# Patient Record
Sex: Male | Born: 1970
Health system: Southern US, Community
[De-identification: ages and names within clinical notes are randomized; demographics above are authoritative.]

## PROBLEM LIST (undated history)

## (undated) DIAGNOSIS — T7840XA Allergy, unspecified, initial encounter: Secondary | ICD-10-CM

## (undated) HISTORY — PX: WISDOM TOOTH EXTRACTION: SHX21

## (undated) HISTORY — DX: Allergy, unspecified, initial encounter: T78.40XA

---

## 2011-11-28 ENCOUNTER — Ambulatory Visit: Payer: Federal, State, Local not specified - PPO

## 2011-11-28 ENCOUNTER — Ambulatory Visit (INDEPENDENT_AMBULATORY_CARE_PROVIDER_SITE_OTHER): Payer: Federal, State, Local not specified - PPO | Admitting: Emergency Medicine

## 2011-11-28 VITALS — BP 118/68 | HR 93 | Temp 98.6°F | Resp 16 | Ht 68.5 in | Wt 211.4 lb

## 2011-11-28 DIAGNOSIS — R059 Cough, unspecified: Secondary | ICD-10-CM

## 2011-11-28 DIAGNOSIS — R05 Cough: Secondary | ICD-10-CM

## 2011-11-28 DIAGNOSIS — R0600 Dyspnea, unspecified: Secondary | ICD-10-CM

## 2011-11-28 DIAGNOSIS — R918 Other nonspecific abnormal finding of lung field: Secondary | ICD-10-CM

## 2011-11-28 DIAGNOSIS — R9389 Abnormal findings on diagnostic imaging of other specified body structures: Secondary | ICD-10-CM

## 2011-11-28 DIAGNOSIS — R0609 Other forms of dyspnea: Secondary | ICD-10-CM

## 2011-11-28 MED ORDER — ALBUTEROL SULFATE HFA 108 (90 BASE) MCG/ACT IN AERS: 2.0000 | INHALATION_SPRAY | RESPIRATORY_TRACT | Status: DC | PRN

## 2011-11-28 MED ORDER — IPRATROPIUM BROMIDE 0.03 % NA SOLN: 2.0000 | Freq: Two times a day (BID) | NASAL | Status: DC

## 2011-11-28 NOTE — Progress Notes (Signed)
  Subjective:    Patient ID: Ronald Stephenson, male    DOB: 11-11-1970, 40 y.o.   MRN: [id number]  HPI 41 year old male presents with about 3 month history of chest congestion, postnasal drainage, cough, and rhinorrhea. Last night he had a softball game after which he had an episode of wheezing and shortness of breath.  No wheezing today but does admit to chest congestion.  Does admit to a history seasonal allergies for which he has been taking Claritin daily. Also added Mucinex and Sudafed which have helped slightly.  No history of asthma.  He has never used inhalers. Never smoker.      Review of Systems  Constitutional: Negative for fever and chills.  HENT: Positive for congestion, rhinorrhea and postnasal drip. Negative for sore throat.   Respiratory: Positive for cough, chest tightness and wheezing.   Cardiovascular: Negative for chest pain.  Gastrointestinal: Negative for nausea and vomiting.  All other systems reviewed and are negative.       Objective:   Physical Exam  Constitutional: He is oriented to person, place, and time. He appears well-developed and well-nourished.  HENT:  Head: Normocephalic and atraumatic.  Right Ear: External ear normal.  Left Ear: External ear normal.  Mouth/Throat: Oropharynx is clear and moist. No oropharyngeal exudate.  Eyes: Conjunctivae normal are normal.  Neck: Normal range of motion. Neck supple.  Cardiovascular: Normal rate, regular rhythm and normal heart sounds.   Pulmonary/Chest: Effort normal and breath sounds normal.  Abdominal: Soft. Bowel sounds are normal.  Musculoskeletal: Normal range of motion.  Lymphadenopathy:    He has no cervical adenopathy.  Neurological: He is alert and oriented to person, place, and time.  Psychiatric: He has a normal mood and affect. His behavior is normal. Judgment and thought content normal.      [facility] reading (PRIMARY) by  Dr. Cleta Alberts as suspected widening anterior mediastinum. Please comment.        Assessment & Plan:   1. Dyspnea  DG Chest 2 View, CT Chest Wo Contrast  2. Cough  DG Chest 2 View  3. Abnormal CXR  CT Chest Wo Contrast   Likely reactive airway disease secondary to allergic rhinitis Continue Claritin daily Atrovent NS bid Albuterol prn wheezing Will set up CT Chest without contrast to evaluate mediastinal widening and further evaluate for possible mass.  Follow up as needed.

## 2011-11-30 ENCOUNTER — Ambulatory Visit
Admission: RE | Admit: 2011-11-30 | Discharge: 2011-11-30 | Disposition: A | Discharge status: Home / Self Care | Payer: Self-pay | Source: Ambulatory Visit | Attending: Physician Assistant | Admitting: Physician Assistant

## 2011-11-30 ENCOUNTER — Telehealth: Payer: Self-pay | Admitting: Radiology

## 2011-11-30 DIAGNOSIS — R0600 Dyspnea, unspecified: Secondary | ICD-10-CM

## 2011-11-30 DIAGNOSIS — R9389 Abnormal findings on diagnostic imaging of other specified body structures: Secondary | ICD-10-CM

## 2011-11-30 NOTE — Telephone Encounter (Signed)
CT chest call report, is negative, have called patient to advise, if further is needed with this patient please let me know.

## 2011-12-04 NOTE — Telephone Encounter (Signed)
Pt seen on 11-28-11 and pt started running fever last night with cold symptoms. And pt would like to see if a rx for antibiotic can be called in. [phone]

## 2011-12-05 NOTE — Telephone Encounter (Signed)
LMOM to RTC. 

## 2011-12-05 NOTE — Telephone Encounter (Signed)
Recommend he RTC for further evaluation.

## 2011-12-06 ENCOUNTER — Ambulatory Visit (INDEPENDENT_AMBULATORY_CARE_PROVIDER_SITE_OTHER): Payer: Federal, State, Local not specified - PPO | Admitting: Family Medicine

## 2011-12-06 VITALS — BP 122/70 | HR 75 | Temp 97.0°F | Resp 16 | Ht 69.0 in | Wt 206.0 lb

## 2011-12-06 DIAGNOSIS — R0989 Other specified symptoms and signs involving the circulatory and respiratory systems: Secondary | ICD-10-CM

## 2011-12-06 DIAGNOSIS — R05 Cough: Secondary | ICD-10-CM

## 2011-12-06 DIAGNOSIS — R059 Cough, unspecified: Secondary | ICD-10-CM

## 2011-12-06 DIAGNOSIS — J329 Chronic sinusitis, unspecified: Secondary | ICD-10-CM

## 2011-12-06 MED ORDER — HYDROCODONE-HOMATROPINE 5-1.5 MG/5ML PO SYRP: 5.0000 mL | ORAL_SOLUTION | Freq: Every evening | ORAL | Status: DC | PRN

## 2011-12-06 MED ORDER — DOXYCYCLINE HYCLATE 100 MG PO TABS: 100.0000 mg | ORAL_TABLET | Freq: Two times a day (BID) | ORAL | Status: DC

## 2011-12-06 NOTE — Progress Notes (Signed)
[facility]:  Office Visit  Chief Complaint:  Chief Complaint  Patient presents with  . URI    since last ov    HPI: Ronald Stephenson is a 41 y.o. male who complains of  Chest congestion worse since last visit, now in sinuses, no fevers, chills. + cough, green sputum. No ear pain. No CP. + wheezing at night uses albuterol inh.   Past Medical History  Diagnosis Date  . Allergy    Past Surgical History  Procedure Date  . Wisdom tooth extraction    History   Social History  . Marital Status: Married    Spouse Name: N/A    Number of Children: N/A  . Years of Education: N/A   Social History Main Topics  . Smoking status: Never Smoker   . Smokeless tobacco: None  . Alcohol Use: Yes     social  . Drug Use: None  . Sexually Active: None   Other Topics Concern  . None   Social History Narrative  . None   Family History  Problem Relation Age of Onset  . Emphysema Maternal Grandmother   . Cancer Maternal Grandfather     bone cancer  . Heart disease Paternal Grandmother    Allergies  Allergen Reactions  . Erythromycin    Prior to Admission medications   Medication Sig Start Date End Date Taking? Authorizing Provider  albuterol (PROVENTIL HFA;VENTOLIN HFA) 108 (90 BASE) MCG/ACT inhaler Inhale 2 puffs into the lungs every 4 (four) hours as needed for wheezing (cough, shortness of breath or wheezing.). 11/28/11  Yes Heather M Marte, PA-C  cetirizine (ZYRTEC) 10 MG tablet Take 10 mg by mouth daily.   Yes Historical Provider, MD  ipratropium (ATROVENT) 0.03 % nasal spray Place 2 sprays into the nose 2 (two) times daily. 11/28/11  Yes Heather M Marte, PA-C  pseudoephedrine (SUDAFED) 30 MG tablet Take 30 mg by mouth every 4 (four) hours as needed.   Yes Historical Provider, MD  guaiFENesin (MUCINEX) 600 MG 12 hr tablet Take 1,200 mg by mouth 2 (two) times daily.    Historical Provider, MD  loratadine (CLARITIN) 10 MG tablet Take 10 mg by mouth daily.     Historical Provider, MD     ROS: The patient denies fevers, chills, night sweats, unintentional weight loss, chest pain, palpitations, wheezing, dyspnea on exertion, nausea, vomiting, abdominal pain, dysuria, hematuria, melena, numbness, weakness, or tingling.  All other systems have been reviewed and were otherwise negative with the exception of those mentioned in the HPI and as above.    PHYSICAL EXAM: Filed Vitals:   12/06/11 1428  BP: 122/70  Pulse: 75  Temp: 97 F (36.1 C)  Resp: 16   Filed Vitals:   12/06/11 1428  Height: 5\' 9"  (1.753 m)  Weight: 206 lb (93.441 kg)   Body mass index is 30.42 kg/(m^2).  General: Alert, no acute distress HEENT:  Normocephalic, atraumatic, oropharynx patent. TM nl. No exudates. + sinus tenderness Cardiovascular:  Regular rate and rhythm, no rubs murmurs or gallops.  No Carotid bruits, radial pulse intact. No pedal edema.  Respiratory: Clear to auscultation bilaterally.  No wheezes, rales, or rhonchi.  No cyanosis, no use of accessory musculature GI: No organomegaly, abdomen is soft and non-tender, positive bowel sounds.  No masses. Skin: No rashes. Neurologic: Facial musculature symmetric. Psychiatric: Patient is appropriate throughout our interaction. Lymphatic: No cervical lymphadenopathy Musculoskeletal: Gait intact.   LABS: No results found for this or  any previous visit.   EKG/XRAY:   Primary read interpreted by Dr. Conley Rolls at [facility].   ASSESSMENT/PLAN: Encounter Diagnoses  Name Primary?  . Sinusitis Yes  . Chest congestion   . Cough    F/u prn Patient has erythromycin allergy Will rx Doxycycline Continue with other meds for sxs treatmentprn   Reichen Hutzler PHUONG, DO 12/06/2011 3:50 PM

## 2012-03-04 ENCOUNTER — Emergency Department (INDEPENDENT_AMBULATORY_CARE_PROVIDER_SITE_OTHER)
Admission: EM | Admit: 2012-03-04 | Discharge: 2012-03-04 | Disposition: A | Discharge status: Home / Self Care | Payer: BC Managed Care – PPO | Source: Home / Self Care | Attending: Family Medicine | Admitting: Family Medicine

## 2012-03-04 ENCOUNTER — Encounter: Payer: Self-pay | Admitting: *Deleted

## 2012-03-04 DIAGNOSIS — J9801 Acute bronchospasm: Secondary | ICD-10-CM

## 2012-03-04 DIAGNOSIS — J209 Acute bronchitis, unspecified: Secondary | ICD-10-CM

## 2012-03-04 LAB — POCT CBC W AUTO DIFF (K'VILLE URGENT CARE)

## 2012-03-04 MED ORDER — AZITHROMYCIN 250 MG PO TABS: ORAL_TABLET | ORAL | Status: DC

## 2012-03-04 MED ORDER — PREDNISONE 20 MG PO TABS: 20.0000 mg | ORAL_TABLET | Freq: Two times a day (BID) | ORAL | Status: DC

## 2012-03-04 MED ORDER — BENZONATATE 200 MG PO CAPS: 200.0000 mg | ORAL_CAPSULE | Freq: Every day | ORAL | Status: DC

## 2012-03-04 MED ORDER — FLUTICASONE-SALMETEROL 100-50 MCG/DOSE IN AEPB: 1.0000 | INHALATION_SPRAY | Freq: Two times a day (BID) | RESPIRATORY_TRACT | Status: DC

## 2012-03-04 NOTE — ED Provider Notes (Signed)
History     CSN: [id number]  Arrival date & time 03/04/12  1051   First MD Initiated Contact with Patient 03/04/12 1107      Chief Complaint  Patient presents with  . Fever  . Shortness of Breath      HPI Comments: Patient complains of two day history of myalgias, persistent low grade fever, non-productive cough, fatigue, and increased wheezing.  He also has a sense of anterior chest tightness and mild shortness of breath at times.  No sore throat or nasal congestion. He recalls that last summer he began wheezing after a softball game, and in October 2013 he had a rather prolonged respiratory illness with wheezing that responded to doxycycline.  Since then he notes that he wheezes several times per week and must use a Ventolin inhaler 2 to 3 times per week.  He had no previous diagnosis of asthma.  He has a sister who was diagnosed with exercise induced asthma in college.  He has had a seasonal flu shot.  He does not remember his last Tdap.  The history is provided by the patient.    Past Medical History  Diagnosis Date  . Allergy     Past Surgical History  Procedure Date  . Wisdom tooth extraction     Family History  Problem Relation Age of Onset  . Emphysema Maternal Grandmother   . Cancer Maternal Grandfather     bone cancer  . Heart disease Paternal Grandmother   . Heart disease Father   . Hypertension Father   . Asthma Sister     History  Substance Use Topics  . Smoking status: Never Smoker   . Smokeless tobacco: Not on file  . Alcohol Use: Yes     Comment: social      Review of Systems No sore throat + cough No pleuritic pain but has anterior chest tightness + wheezing No nasal congestion No post-nasal drainage No sinus pain/pressure No itchy/red eyes No earache No hemoptysis + SOB + fever, + chills No nausea No vomiting No abdominal pain No diarrhea No urinary symptoms No skin rashes + fatigue + myalgias + headache Used OTC meds  without relief  Allergies  Erythromycin:  GI intolerance only  Home Medications   Current Outpatient Rx  Name  Route  Sig  Dispense  Refill  . ALBUTEROL SULFATE HFA 108 (90 BASE) MCG/ACT IN AERS   Inhalation   Inhale 2 puffs into the lungs every 4 (four) hours as needed for wheezing (cough, shortness of breath or wheezing.).   1 Inhaler   1   . AZITHROMYCIN 250 MG PO TABS      Take 2 tabs today; then begin one tab once daily for 4 more days   6 each   0   . BENZONATATE 200 MG PO CAPS   Oral   Take 1 capsule (200 mg total) by mouth at bedtime. Take as needed for cough   12 capsule   0   . CETIRIZINE HCL 10 MG PO TABS   Oral   Take 10 mg by mouth daily.         Marland Kitchen DOXYCYCLINE HYCLATE 100 MG PO TABS   Oral   Take 1 tablet (100 mg total) by mouth 2 (two) times daily.   20 tablet   0   . FLUTICASONE-SALMETEROL 100-50 MCG/DOSE IN AEPB   Inhalation   Inhale 1 puff into the lungs 2 (two) times daily.   60 each  1   . GUAIFENESIN ER 600 MG PO TB12   Oral   Take 1,200 mg by mouth 2 (two) times daily.         Marland Kitchen HYDROCODONE-HOMATROPINE 5-1.5 MG/5ML PO SYRP   Oral   Take 5 mLs by mouth at bedtime as needed for cough.   120 mL   0   . IPRATROPIUM BROMIDE 0.03 % NA SOLN   Nasal   Place 2 sprays into the nose 2 (two) times daily.   30 mL   5   . LORATADINE 10 MG PO TABS   Oral   Take 10 mg by mouth daily.         Marland Kitchen PREDNISONE 20 MG PO TABS   Oral   Take 1 tablet (20 mg total) by mouth 2 (two) times daily.   10 tablet   0   . PSEUDOEPHEDRINE HCL 30 MG PO TABS   Oral   Take 30 mg by mouth every 4 (four) hours as needed.           BP 135/87  Pulse 107  Temp 99.2 F (37.3 C) (Oral)  Resp 18  Wt 207 lb (93.895 kg)  SpO2 95%  Physical Exam Nursing notes and Vital Signs reviewed. Appearance:  Patient appears healthy, stated age, and in no acute distress Eyes:  Pupils are equal, round, and reactive to light and accomodation.  Extraocular movement  is intact.  Conjunctivae are not inflamed  Ears:  Canals normal.  Tympanic membranes normal.  Nose:  Mildly congested turbinates.  No sinus tenderness.   Pharynx:  Normal Neck:  Supple.  No adenopathy Lungs:   Faint wheeze heard left posterior chest but no rales or rhonchi.  Breath sounds are equal.  Heart:  Regular rate and rhythm without murmurs, rubs, or gallops.  Abdomen:  Nontender without masses or hepatosplenomegaly.  Bowel sounds are present.  No CVA or flank tenderness.  Extremities:  No edema.  No calf tenderness Skin:  No rash present.   ED Course  Procedures none   Labs Reviewed  POCT CBC W AUTO DIFF ([facility])  WBC 6.5; LY 18.8; MO 10.1; GR 71.1; Hgb 14.5; Platelets 260       1. Acute bronchitis   2. Bronchospasm       MDM  Begin Z-pack to cover atypical agents (note that patient has taken in past without adverse effects).  Begin prednisone burst.  Begin trial of Advair. Prescription written for Benzonatate [facility]) to take at bedtime for night-time cough.  Take plain Mucinex (guaifenesin) twice daily for cough and congestion.  May add Sudafed if sinus congestion develops.  Increase fluid intake, rest. For sinus congestion may also use Afrin nasal spray (or generic oxymetazoline) twice daily for about 5 days.  Also recommend using saline nasal spray several times daily and saline nasal irrigation (AYR is a common brand) Stop all antihistamines for now, and other non-prescription cough/cold preparations. Continue Ventolin inhaler as needed. Recommend a Tdap when well.  Follow-up with family doctor if not improving 7 to 10 days.  Consider evaluation by a pulmonologist if wheezing does not improve significantly with Advair        Lattie Haw, MD 03/04/12 [phone]

## 2012-03-04 NOTE — ED Notes (Signed)
Pt c/o fever, SOB and cough x 2 days. He has taken Mucinex and Advil.

## 2012-03-05 ENCOUNTER — Telehealth: Payer: Self-pay | Admitting: *Deleted

## 2012-04-24 ENCOUNTER — Ambulatory Visit (INDEPENDENT_AMBULATORY_CARE_PROVIDER_SITE_OTHER): Payer: Federal, State, Local not specified - PPO | Admitting: Internal Medicine

## 2012-04-24 ENCOUNTER — Encounter: Payer: Self-pay | Admitting: Internal Medicine

## 2012-04-24 VITALS — BP 146/82 | HR 90 | Ht 68.0 in | Wt 211.6 lb

## 2012-04-24 DIAGNOSIS — J45998 Other asthma: Secondary | ICD-10-CM

## 2012-04-24 DIAGNOSIS — J302 Other seasonal allergic rhinitis: Secondary | ICD-10-CM | POA: Insufficient documentation

## 2012-04-24 DIAGNOSIS — J309 Allergic rhinitis, unspecified: Secondary | ICD-10-CM

## 2012-04-24 DIAGNOSIS — J45909 Unspecified asthma, uncomplicated: Secondary | ICD-10-CM

## 2012-04-24 NOTE — Progress Notes (Signed)
04/24/12- 41 yoM never smoker -Self referral-wheezing; states feeling better since finishing Advair. Has noted sneeze, nasal congestion and drainage last 2 summers. Has lived n this area x 20 years. New onset of wheezing first noted in Oct, 2013 after a softball game- wife noted he was wheezing. An Urgent Care put him on rescue inhaler, which helped. In Jan 2014, a viral syndrome acute bronchitis was Rx'd w/ prednisone taper, Zpak, Advair. Ran out of Advair 2 weeks ago and has remained clear. Has not needed his rescue inhaler. He now feels well. He had no prior lung disease or child-hood asthma. 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 No hx of cardiac disease. Sister has exercise asthma. CT chest 11/30/11 IMPRESSION:  Negative. No mass or lymphadenopathy within the thorax.  Original Report Authenticated By: Danae Orleans, M.D.  Prior to Admission medications   Medication Sig Start Date End Date Taking? Authorizing Provider  albuterol (PROVENTIL HFA;VENTOLIN HFA) 108 (90 BASE) MCG/ACT inhaler Inhale 2 puffs into the lungs every 4 (four) hours as needed for wheezing (cough, shortness of breath or wheezing.). 11/28/11  Yes Heather M Marte, PA-C  guaiFENesin (MUCINEX) 600 MG 12 hr tablet Take 1,200 mg by mouth 2 (two) times daily.    Historical Provider, MD  loratadine (CLARITIN) 10 MG tablet Take 10 mg by mouth daily.    Historical Provider, MD  pseudoephedrine (SUDAFED) 30 MG tablet Take 30 mg by mouth every 4 (four) hours as needed.    Historical Provider, MD   Past Medical History  Diagnosis Date  . Allergy    Past Surgical History  Procedure Laterality Date  . Wisdom tooth extraction     Family History  Problem Relation Age of Onset  . Emphysema Maternal Grandmother   . Cancer Maternal Grandfather     bone cancer  . Heart disease Paternal Grandmother   . Heart disease Father   . Hypertension Father   . Asthma Sister    History   Social History  .  Marital Status: Married    Spouse Name: N/A    Number of Children: N/A  . Years of Education: N/A   Occupational History  . Not on file.   Social History Main Topics  . Smoking status: Never Smoker   . Smokeless tobacco: Not on file  . Alcohol Use: Yes     Comment: social  . Drug Use: Not on file  . Sexually Active: Not on file   Other Topics Concern  . Not on file   Social History Narrative  . No narrative on file   ROS-see HPI Constitutional:   No-   weight loss, night sweats, fevers, chills, fatigue, lassitude. HEENT:   No-  headaches, difficulty swallowing, tooth/dental problems, sore throat,       No-  sneezing, itching, ear ache, nasal congestion, post nasal drip,  CV:  No-   chest pain, orthopnea, PND, swelling in lower extremities, anasarca,                                  dizziness, palpitations Resp: No-   shortness of breath with exertion or at rest.              No-   productive cough,  No non-productive cough,  No- coughing up of blood.  No-   change in color of mucus.  No- wheezing.   Skin: No-   rash or lesions. GI:  No-   heartburn, indigestion, abdominal pain, nausea, vomiting, diarrhea,                 change in bowel habits, loss of appetite GU: No-   dysuria, change in color of urine, no urgency or frequency.  No- flank pain. MS:  No-   joint pain or swelling.  No- decreased range of motion.  No- back pain. Neuro-     nothing unusual Psych:  No- change in mood or affect. No depression or anxiety.  No memory loss.  OBJ- Physical Exam General- Alert, Oriented, Affect-appropriate, Distress- none acute Skin- rash-none, lesions- none, excoriation- none Lymphadenopathy- none Head- atraumatic. Thick neck            Eyes- Gross vision intact, PERRLA, conjunctivae and secretions clear            Ears- Hearing, canals-normal            Nose- Clear, no-Septal dev, mucus, polyps, erosion, perforation             Throat- Mallampati III , mucosa clear  , drainage- none, tonsils- atrophic Neck- flexible , trachea midline, no stridor , thyroid nl, carotid no bruit Chest - symmetrical excursion , unlabored           Heart/CV- RRR , no murmur , no gallop  , no rub, nl s1 s2                           - JVD- none , edema- none, stasis changes- none, varices- none           Lung- clear to P&A, wheeze- none, cough- none , dullness-none, rub- none           Chest wall-  Abd- tender-no, distended-no, bowel sounds-present, HSM- no Br/ Gen/ Rectal- Not done, not indicated Extrem- cyanosis- none, clubbing, none, atrophy- none, strength- nl Neuro- grossly intact to observation

## 2012-04-24 NOTE — Patient Instructions (Addendum)
Order- schedule PFT    Dx asthma  Ok to use the rescue inhaler Ventolin HFA   2 puffs every 4 hours if needed  Please call as needed

## 2012-04-24 NOTE — Assessment & Plan Note (Signed)
We discussed use of non-sedating otc antihistamines as needed.

## 2012-04-24 NOTE — Assessment & Plan Note (Signed)
Unclear reason for onset of wheezing after softball game in the Fall of 2013, persisting through the winter. Not clear if Advair caused resolution, or he got better with time. Plan- PFT to establish current lung status. Revisit in Spring pollen season.

## 2012-06-26 ENCOUNTER — Ambulatory Visit: Payer: Federal, State, Local not specified - PPO | Admitting: Internal Medicine

## 2012-07-01 ENCOUNTER — Ambulatory Visit (INDEPENDENT_AMBULATORY_CARE_PROVIDER_SITE_OTHER): Payer: Federal, State, Local not specified - PPO | Admitting: Internal Medicine

## 2012-07-01 ENCOUNTER — Encounter: Payer: Self-pay | Admitting: Internal Medicine

## 2012-07-01 ENCOUNTER — Other Ambulatory Visit: Payer: Federal, State, Local not specified - PPO

## 2012-07-01 VITALS — BP 142/80 | HR 80 | Temp 100.0°F | Ht 68.0 in | Wt 209.0 lb

## 2012-07-01 DIAGNOSIS — J45909 Unspecified asthma, uncomplicated: Secondary | ICD-10-CM

## 2012-07-01 DIAGNOSIS — J45998 Other asthma: Secondary | ICD-10-CM

## 2012-07-01 LAB — PULMONARY FUNCTION TEST

## 2012-07-01 MED ORDER — FLUNISOLIDE HFA 80 MCG/ACT IN AERS: 2.0000 | INHALATION_SPRAY | Freq: Two times a day (BID) | RESPIRATORY_TRACT | Status: DC

## 2012-07-01 MED ORDER — FLUTICASONE PROPIONATE HFA 110 MCG/ACT IN AERO: INHALATION_SPRAY | RESPIRATORY_TRACT | Status: DC

## 2012-07-01 NOTE — Progress Notes (Signed)
04/24/12- 41 yoM never smoker -Self referral-wheezing; states feeling better since finishing Advair. Has noted sneeze, nasal congestion and drainage last 2 summers. Has lived n this area x 20 years. New onset of wheezing first noted in Oct, 2013 after a softball game- wife noted he was wheezing. An Urgent Care put him on rescue inhaler, which helped. In Jan 2014, a viral syndrome acute bronchitis was Rx'd w/ prednisone taper, Zpak, Advair. Ran out of Advair 2 weeks ago and has remained clear. Has not needed his rescue inhaler. He now feels well. He had no prior lung disease or child-hood asthma. 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 No hx of cardiac disease. Sister has exercise asthma. CT chest 11/30/11 IMPRESSION:  Negative. No mass or lymphadenopathy within the thorax.  Original Report Authenticated By: Danae Orleans, M.D.  07/01/12- 64 yoM never smoker -followed for asthma, rhinitis. FOLLOWS FOR:had PFT today-c/o nasal and chest congestion x 1wk.,cough-dk. green,clear;sob worse,occass. wheezing,no cp or tightness, had fever last wk. only Did well after last here until starting one week ago when he developed sore throat and green discharge, which is now clear. Using Ventolin 2 or 3 times per week. PFT 07/01/2012-mild to moderate obstructive airways disease with response to bronchodilator, normal lung volumes, normal diffusion capacity. FVC 3.93/77%, FEV1 3.04/75%, FEV1/FVC 0.77, FEF 25-75% 2.66/70%. TLC 85%, DLCO 104%.  ROS-see HPI Constitutional:   No-   weight loss, night sweats, fevers, chills, fatigue, lassitude. HEENT:   No-  headaches, difficulty swallowing, tooth/dental problems, sore throat,       +sneezing, itching, ear ache, nasal congestion, post nasal drip,  CV:  No-   chest pain, orthopnea, PND, swelling in lower extremities, anasarca,                                  dizziness, palpitations Resp: No-   shortness of breath with exertion or at rest.               + productive cough,  No non-productive cough,  No- coughing up of blood.              + change in color of mucus.  + wheezing.   Skin: No-   rash or lesions. GI:  No-   heartburn, indigestion, abdominal pain, nausea, vomiting,  GU: . MS:  No-   joint pain or swelling.  these Neuro-     nothing unusual Psych:  No- change in mood or affect. No depression or anxiety.  No memory loss.  OBJ- Physical Exam General- Alert, Oriented, Affect-appropriate, Distress- none acute Skin- rash-none, lesions- none, excoriation- none Lymphadenopathy- none Head- atraumatic. Thick neck            Eyes- Gross vision intact, PERRLA, conjunctivae and secretions clear            Ears- Hearing, canals-normal            Nose- Clear, no-Septal dev, +mucus, polyps, erosion, perforation             Throat- Mallampati III-IV , mucosa clear , drainage+clear mucus, tonsils- atrophic Neck- flexible , trachea midline, no stridor , thyroid nl, carotid no bruit Chest - symmetrical excursion , unlabored           Heart/CV- RRR , no murmur , no gallop  , no rub, nl s1 s2                           -  JVD- none , edema- none, stasis changes- none, varices- none           Lung-  wheeze-+mild, unlabored, cough- none , dullness-none, rub- none           Chest wall-  Abd-  Br/ Gen/ Rectal- Not done, not indicated Extrem- cyanosis- none, clubbing, none, atrophy- none, strength- nl Neuro- grossly intact to observation

## 2012-07-01 NOTE — Progress Notes (Signed)
PFT done today. 

## 2012-07-01 NOTE — Patient Instructions (Addendum)
Sample Aerospan steroid maintenance inhaler    2 puffs then rinse mouth, twice daily  When the sample is filled, switch to the prescribed equivalent Flovent 110, same directions  Use the Ventolin rescue inhaler as needed  Order- lab   Allergy profile   Dx asthma

## 2012-07-02 LAB — ALLERGY FULL PROFILE
Allergen, D pternoyssinus,d7: <0.1 kU/L
Allergen,Goose feathers, e70: <0.1 kU/L
Box Elder IgE: <0.1 kU/L
Candida Albicans: <0.1 kU/L
Common Ragweed: <0.1 kU/L
G005 Rye, Perennial: <0.1 kU/L
G009 Red Top: <0.1 kU/L
House Dust Hollister: <0.1 kU/L
IgE (Immunoglobulin E), Serum: 17.5 IU/mL (ref 0.0–180.0)
Oak: <0.1 kU/L
Stemphylium Botryosum: <0.1 kU/L
Timothy Grass: <0.1 kU/L

## 2012-07-04 ENCOUNTER — Telehealth: Payer: Self-pay | Admitting: Internal Medicine

## 2012-07-04 MED ORDER — AMOXICILLIN-POT CLAVULANATE 875-125 MG PO TABS: 1.0000 | ORAL_TABLET | Freq: Two times a day (BID) | ORAL | Status: DC

## 2012-07-04 NOTE — Telephone Encounter (Signed)
Lab results as follows: Notes Recorded by Waymon Budge, MD on 07/03/2012 at 1:27 PM IgE class allergy antibodies are not elevated for the common triggers. We can discuss at next ov. Pt aware of lab results.   Last OV 07/01/12. Pt called to c/o having productive cough with yellow phlegm, chest congestion, sinus congestion, wheezing x 1.5 weeks. Pt states he had these symptoms at last OV but felt they would get better on their own but instead they have gotten worse. Please advise. Carron Curie, CMA Allergies  Allergen Reactions  . Erythromycin

## 2012-07-04 NOTE — Telephone Encounter (Signed)
Called and spoke with pt and he is aware that per CY---call in augmentin 875 mg  #14  1 po bid until gone.  Pt is aware that this has been called to his pharmacy and nothing further is needed.

## 2012-07-12 NOTE — Assessment & Plan Note (Signed)
Recent acute exacerbation sounds viral but could possibly have been an allergic episode in pollen season. Plan-try steroid inhaler. We can give Aerospan sample but then cheaper prescription for Flovent 110. Lab for allergy profile.

## 2012-07-24 ENCOUNTER — Encounter: Payer: Self-pay | Admitting: Internal Medicine

## 2012-10-03 ENCOUNTER — Ambulatory Visit: Payer: Federal, State, Local not specified - PPO | Admitting: Internal Medicine

## 2012-10-17 ENCOUNTER — Ambulatory Visit (INDEPENDENT_AMBULATORY_CARE_PROVIDER_SITE_OTHER): Payer: Federal, State, Local not specified - PPO | Admitting: Internal Medicine

## 2012-10-17 ENCOUNTER — Encounter: Payer: Self-pay | Admitting: Internal Medicine

## 2012-10-17 VITALS — BP 140/90 | HR 82 | Ht 69.0 in | Wt 211.8 lb

## 2012-10-17 DIAGNOSIS — J45909 Unspecified asthma, uncomplicated: Secondary | ICD-10-CM

## 2012-10-17 DIAGNOSIS — J45998 Other asthma: Secondary | ICD-10-CM

## 2012-10-17 NOTE — Patient Instructions (Addendum)
Ok if you remain stable, to reduce the Flovent 110 to 2 puffs, then rinse, ONCE daily.  If you see no change over the next few weeks, you may choose to drop it off entirely and watch. We would increase it if you found yourself needing the rescue inhaler more than 2-3 times per week, or if you felt a cold coming on.   Please call as needed

## 2012-10-17 NOTE — Progress Notes (Signed)
04/24/12- 41 yoM never smoker -Self referral-wheezing; states feeling better since finishing Advair. Has noted sneeze, nasal congestion and drainage last 2 summers. Has lived n this area x 20 years. New onset of wheezing first noted in Oct, 2013 after a softball game- wife noted he was wheezing. An Urgent Care put him on rescue inhaler, which helped. In Jan 2014, a viral syndrome acute bronchitis was Rx'd w/ prednisone taper, Zpak, Advair. Ran out of Advair 2 weeks ago and has remained clear. Has not needed his rescue inhaler. He now feels well. He had no prior lung disease or child-hood asthma. 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 No hx of cardiac disease. Sister has exercise asthma. CT chest 11/30/11 IMPRESSION:  Negative. No mass or lymphadenopathy within the thorax.  Original Report Authenticated By: Danae Orleans, M.D.  07/01/12- 89 yoM never smoker -followed for asthma, rhinitis. FOLLOWS FOR:had PFT today-c/o nasal and chest congestion x 1wk.,cough-dk. green,clear;sob worse,occass. wheezing,no cp or tightness, had fever last wk. only Did well after last here until starting one week ago when he developed sore throat and green discharge, which is now clear. Using Ventolin 2 or 3 times per week. PFT 07/01/2012-mild to moderate obstructive airways disease with response to bronchodilator, normal lung volumes, normal diffusion capacity. FVC 3.93/77%, FEV1 3.04/75%, FEV1/FVC 0.77, FEF 25-75% 2.66/70%. TLC 85%, DLCO 104%.  10/17/12- 42 yoM never smoker -followed for asthma, rhinitis. FOLLOWS FOR: reports breathing is doing well.  no new complaints. We discussed medication strategies  ROS-see HPI Constitutional:   No-   weight loss, night sweats, fevers, chills, fatigue, lassitude. HEENT:   No-  headaches, difficulty swallowing, tooth/dental problems, sore throat,       No-sneezing, itching, ear ache, nasal congestion, post nasal drip,  CV:  No-   chest pain,  orthopnea, PND, swelling in lower extremities, anasarca,  dizziness, palpitations Resp: No-   shortness of breath with exertion or at rest.              No- productive cough,  No non-productive cough,  No- coughing up of blood.              No- change in color of mucus.  No- wheezing.   Skin: No-   rash or lesions. GI:  No-   heartburn, indigestion, abdominal pain, nausea, vomiting,  GU: . MS:  No-   joint pain or swelling.  these Neuro-     nothing unusual Psych:  No- change in mood or affect. No depression or anxiety.  No memory loss.  OBJ- Physical Exam General- Alert, Oriented, Affect-appropriate, Distress- none acute Skin- rash-none, lesions- none, excoriation- none Lymphadenopathy- none Head- atraumatic. Thick neck            Eyes- Gross vision intact, PERRLA, conjunctivae and secretions clear            Ears- Hearing, canals-normal            Nose- Clear, no-Septal dev, +mucus,no-polyps, erosion, perforation             Throat- Mallampati III-IV , mucosa clear , drainage-none, tonsils- atrophic Neck- flexible , trachea midline, no stridor , thyroid nl, carotid no bruit Chest - symmetrical excursion , unlabored           Heart/CV- RRR , no murmur , no gallop  , no rub, nl s1 s2                           -  JVD- none , edema- none, stasis changes- none, varices- none           Lung-  wheeze-none, unlabored, cough- none , dullness-none, rub- none           Chest wall-  Abd-  Br/ Gen/ Rectal- Not done, not indicated Extrem- cyanosis- none, clubbing, none, atrophy- none, strength- nl Neuro- grossly intact to observation

## 2012-10-26 NOTE — Assessment & Plan Note (Signed)
Good control Plan-okay to try reducing Flovent 110 to 2 puffs once daily while he is this stable

## 2013-04-24 ENCOUNTER — Ambulatory Visit: Payer: Federal, State, Local not specified - PPO | Admitting: Internal Medicine

## 2013-04-30 ENCOUNTER — Encounter: Payer: Self-pay | Admitting: Internal Medicine

## 2013-04-30 ENCOUNTER — Ambulatory Visit (INDEPENDENT_AMBULATORY_CARE_PROVIDER_SITE_OTHER): Payer: Federal, State, Local not specified - PPO | Admitting: Internal Medicine

## 2013-04-30 ENCOUNTER — Encounter (INDEPENDENT_AMBULATORY_CARE_PROVIDER_SITE_OTHER): Payer: Self-pay

## 2013-04-30 VITALS — BP 120/76 | HR 71 | Ht 69.0 in | Wt 216.8 lb

## 2013-04-30 DIAGNOSIS — J45998 Other asthma: Secondary | ICD-10-CM

## 2013-04-30 DIAGNOSIS — J309 Allergic rhinitis, unspecified: Secondary | ICD-10-CM

## 2013-04-30 DIAGNOSIS — J302 Other seasonal allergic rhinitis: Secondary | ICD-10-CM

## 2013-04-30 DIAGNOSIS — J45909 Unspecified asthma, uncomplicated: Secondary | ICD-10-CM

## 2013-04-30 MED ORDER — ALBUTEROL SULFATE HFA 108 (90 BASE) MCG/ACT IN AERS: 2.0000 | INHALATION_SPRAY | RESPIRATORY_TRACT | Status: DC | PRN

## 2013-04-30 NOTE — Progress Notes (Signed)
04/24/12- 41 yoM never smoker -Self referral-wheezing; states feeling better since finishing Advair. Has noted sneeze, nasal congestion and drainage last 2 summers. Has lived n this area x 20 years. New onset of wheezing first noted in Oct, 2013 after a softball game- wife noted he was wheezing. An Urgent Care put him on rescue inhaler, which helped. In Jan 2014, a viral syndrome acute bronchitis was Rx'd w/ prednisone taper, Zpak, Advair. Ran out of Advair 2 weeks ago and has remained clear. Has not needed his rescue inhaler. He now feels well. He had no prior lung disease or child-hood asthma. 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 No hx of cardiac disease. Sister has exercise asthma. CT chest 11/30/11 IMPRESSION:  Negative. No mass or lymphadenopathy within the thorax.  Original Report Authenticated By: Danae Orleans, M.D.  07/01/12- 78 yoM never smoker -followed for asthma, rhinitis. FOLLOWS FOR:had PFT today-c/o nasal and chest congestion x 1wk.,cough-dk. green,clear;sob worse,occass. wheezing,no cp or tightness, had fever last wk. only Did well after last here until starting one week ago when he developed sore throat and green discharge, which is now clear. Using Ventolin 2 or 3 times per week. PFT 07/01/2012-mild to moderate obstructive airways disease with response to bronchodilator, normal lung volumes, normal diffusion capacity. FVC 3.93/77%, FEV1 3.04/75%, FEV1/FVC 0.77, FEF 25-75% 2.66/70%. TLC 85%, DLCO 104%.  10/17/12- 42 yoM never smoker -followed for asthma, rhinitis. FOLLOWS FOR: reports breathing is doing well.  no new complaints. We discussed medication strategies  04/30/13- 42 yoM never smoker -followed for asthma, rhinitis. FOLLOWS FOR: Has went back to BID on Flovent as his daughter is in preschoold and bringing home more colds and germs. He feels that colds clear more quickly since he is using Flovent. No seasonal concerns.  ROS-see  HPI Constitutional:   No-   weight loss, night sweats, fevers, chills, fatigue, lassitude. HEENT:   No-  headaches, difficulty swallowing, tooth/dental problems, sore throat,       No-sneezing, itching, ear ache, nasal congestion, post nasal drip,  CV:  No-   chest pain, orthopnea, PND, swelling in lower extremities, anasarca,  dizziness, palpitations Resp: No-   shortness of breath with exertion or at rest.              No- productive cough,  No non-productive cough,  No- coughing up of blood.              No- change in color of mucus.  No- wheezing.   Skin: No-   rash or lesions. GI:  No-  heartburn, indigestion, abdominal pain, nausea, vomiting,  GU: . MS:  No-   joint pain or swelling.  these Neuro-     nothing unusual Psych:  No- change in mood or affect. No depression or anxiety.  No memory loss.  OBJ- Physical Exam General- Alert, Oriented, Affect-appropriate, Distress- none acute Skin- rash-none, lesions- none, excoriation- none Lymphadenopathy- none Head- atraumatic. Thick neck            Eyes- Gross vision intact, PERRLA, conjunctivae and secretions clear            Ears- Hearing, canals-normal            Nose- Clear, no-Septal dev, +mucus,no-polyps, erosion, perforation             Throat- Mallampati III-IV , mucosa clear , drainage-none, tonsils- atrophic Neck- flexible , trachea midline, no stridor , thyroid nl, carotid no bruit  Chest - symmetrical excursion , unlabored           Heart/CV- RRR , no murmur , no gallop  , no rub, nl s1 s2                           - JVD- none , edema- none, stasis changes- none, varices- none           Lung-  wheeze-none, unlabored, cough- none , dullness-none, rub- none           Chest wall-  Abd-  Br/ Gen/ Rectal- Not done, not indicated Extrem- cyanosis- none, clubbing, none, atrophy- none, strength- nl Neuro- grossly intact to observation

## 2013-04-30 NOTE — Assessment & Plan Note (Signed)
Occasional antihistamine should be sufficient as discussed.

## 2013-04-30 NOTE — Assessment & Plan Note (Signed)
Well controlled.  Plan-continue Flovent

## 2013-04-30 NOTE — Patient Instructions (Signed)
Refill script for Proair rescue inhaler sent  Please call as needed

## 2013-07-17 ENCOUNTER — Other Ambulatory Visit: Payer: Self-pay | Admitting: Internal Medicine

## 2013-08-19 ENCOUNTER — Other Ambulatory Visit: Payer: Self-pay | Admitting: Internal Medicine

## 2013-10-16 ENCOUNTER — Telehealth: Payer: Self-pay | Admitting: Internal Medicine

## 2013-10-16 MED ORDER — ALBUTEROL SULFATE HFA 108 (90 BASE) MCG/ACT IN AERS: 2.0000 | INHALATION_SPRAY | RESPIRATORY_TRACT | Status: DC | PRN

## 2013-10-16 NOTE — Telephone Encounter (Signed)
Called and spoke to pt. Refill sent to preferred pharmacy. Pt aware to call back if there are any issues with the medication, albuterol hfa, being filled. Pt verbalized understanding and denied any further questions or concerns at this time.

## 2013-10-19 ENCOUNTER — Telehealth: Payer: Self-pay | Admitting: Internal Medicine

## 2013-10-19 MED ORDER — FLUTICASONE PROPIONATE HFA 110 MCG/ACT IN AERO: INHALATION_SPRAY | RESPIRATORY_TRACT | Status: DC

## 2013-10-19 NOTE — Telephone Encounter (Signed)
Spoke with pt. Aware RX called in for 1 year supply of flovent. Nothing further needed

## 2014-04-30 ENCOUNTER — Ambulatory Visit: Payer: Federal, State, Local not specified - PPO | Admitting: Internal Medicine

## 2014-05-06 ENCOUNTER — Ambulatory Visit (INDEPENDENT_AMBULATORY_CARE_PROVIDER_SITE_OTHER): Payer: Federal, State, Local not specified - PPO | Admitting: Internal Medicine

## 2014-05-06 ENCOUNTER — Encounter: Payer: Self-pay | Admitting: Internal Medicine

## 2014-05-06 VITALS — BP 118/70 | HR 78 | Ht 69.0 in | Wt 212.2 lb

## 2014-05-06 DIAGNOSIS — J45998 Other asthma: Secondary | ICD-10-CM

## 2014-05-06 DIAGNOSIS — J302 Other seasonal allergic rhinitis: Secondary | ICD-10-CM

## 2014-05-06 MED ORDER — ALBUTEROL SULFATE HFA 108 (90 BASE) MCG/ACT IN AERS: 2.0000 | INHALATION_SPRAY | RESPIRATORY_TRACT | Status: DC | PRN

## 2014-05-06 NOTE — Patient Instructions (Signed)
Script printed to hold for albuterol rescue inhaler to keep available  Ok to continue with Flonase otc and with an antihistamine like claritin if needed  Please call if we can help

## 2014-05-06 NOTE — Progress Notes (Signed)
04/24/12- 41 yoM never smoker -Self referral-wheezing; states feeling better since finishing Advair. Has noted sneeze, nasal congestion and drainage last 2 summers. Has lived n this area x 20 years. New onset of wheezing first noted in Oct, 2013 after a softball game- wife noted he was wheezing. An Urgent Care put him on rescue inhaler, which helped. In Jan 2014, a viral syndrome acute bronchitis was Rx'd w/ prednisone taper, Zpak, Advair. Ran out of Advair 2 weeks ago and has remained clear. Has not needed his rescue inhaler. He now feels well. He had no prior lung disease or child-hood asthma. 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 No hx of cardiac disease. Sister has exercise asthma. CT chest 11/30/11 IMPRESSION:  Negative. No mass or lymphadenopathy within the thorax.  Original Report Authenticated By: Danae OrleansJOHN A. STAHL, M.D.  07/01/12- [age] yoM never smoker -followed for asthma, rhinitis. FOLLOWS FOR:had PFT today-c/o nasal and chest congestion x 1wk.,cough-dk. green,clear;sob worse,occass. wheezing,no cp or tightness, had fever last wk. only Did well after last here until starting one week ago when he developed sore throat and green discharge, which is now clear. Using Ventolin 2 or 3 times per week. PFT 07/01/2012-mild to moderate obstructive airways disease with response to bronchodilator, normal lung volumes, normal diffusion capacity. FVC 3.93/77%, FEV1 3.04/75%, FEV1/FVC 0.77, FEF 25-75% 2.66/70%. TLC 85%, DLCO 104%.  10/17/12- 42 yoM never smoker -followed for asthma, rhinitis. FOLLOWS FOR: reports breathing is doing well.  no new complaints. We discussed medication strategies  04/30/13- 42 yoM never smoker -followed for asthma, rhinitis. FOLLOWS FOR: Has went back to BID on Flovent as his daughter is in preschool and bringing home more colds and germs. He feels that colds clear more quickly since he is using Flovent. No seasonal concerns.  05/06/14- 43 yoM  never smoker -followed for asthma, rhinitis FOLLOW FOR States not taking claritin anymore and is using flonase once a day.  Asthma control has been very good with no use of rescue inhaler in over a year.  ROS-see HPI Constitutional:   No-   weight loss, night sweats, fevers, chills, fatigue, lassitude. HEENT:   No-  headaches, difficulty swallowing, tooth/dental problems, sore throat,       No-sneezing, itching, ear ache, nasal congestion, post nasal drip,  CV:  No-   chest pain, orthopnea, PND, swelling in lower extremities, anasarca,  dizziness, palpitations Resp: No-   shortness of breath with exertion or at rest.              No- productive cough,  No non-productive cough,  No- coughing up of blood.  No- change in color of mucus.  No- wheezing.   Skin: No-   rash or lesions. GI:  No-  heartburn, indigestion, abdominal pain, nausea, vomiting,  GU: . MS:  No-   joint pain or swelling.  these Neuro-     nothing unusual Psych:  No- change in mood or affect. No depression or anxiety.  No memory loss.  OBJ- Physical Exam General- Alert, Oriented, Affect-appropriate, Distress- none acute Skin- rash-none, lesions- none, excoriation- none Lymphadenopathy- none Head- atraumatic. Thick neck            Eyes- Gross vision intact, PERRLA, conjunctivae and secretions clear            Ears- Hearing, canals-normal            Nose- Clear, no-Septal dev, +mucus,no-polyps, erosion, perforation  Throat- Mallampati III-IV , mucosa clear , drainage-none, tonsils- atrophic Neck- flexible , trachea midline, no stridor , thyroid nl, carotid no bruit Chest - symmetrical excursion , unlabored           Heart/CV- RRR , no murmur , no gallop  , no rub, nl s1 s2                           - JVD- none , edema- none, stasis changes- none, varices- none           Lung-  wheeze-none, unlabored, cough- none , dullness-none, rub- none           Chest wall-  Abd-  Br/ Gen/ Rectal- Not done, not  indicated Extrem- cyanosis- none, clubbing, none, atrophy- none, strength- nl Neuro- grossly intact to observation

## 2014-05-09 NOTE — Assessment & Plan Note (Signed)
Mild intermittent asthma well-controlled, uncomplicated

## 2014-05-09 NOTE — Assessment & Plan Note (Signed)
He is satisfied with control by use of Flonase for now. We discussed antihistamines which can add if needed later in the pollen season.

## 2014-08-13 ENCOUNTER — Ambulatory Visit (INDEPENDENT_AMBULATORY_CARE_PROVIDER_SITE_OTHER): Payer: Federal, State, Local not specified - PPO | Admitting: Podiatry

## 2014-08-13 ENCOUNTER — Encounter: Payer: Self-pay | Admitting: Podiatry

## 2014-08-13 ENCOUNTER — Ambulatory Visit (INDEPENDENT_AMBULATORY_CARE_PROVIDER_SITE_OTHER): Payer: Federal, State, Local not specified - PPO

## 2014-08-13 VITALS — BP 142/89 | HR 74 | Resp 12

## 2014-08-13 DIAGNOSIS — R52 Pain, unspecified: Secondary | ICD-10-CM

## 2014-08-13 DIAGNOSIS — M722 Plantar fascial fibromatosis: Secondary | ICD-10-CM | POA: Diagnosis not present

## 2014-08-13 DIAGNOSIS — L600 Ingrowing nail: Secondary | ICD-10-CM | POA: Diagnosis not present

## 2014-08-13 MED ORDER — TRIAMCINOLONE ACETONIDE 10 MG/ML IJ SUSP
10.0000 mg | Freq: Once | INTRAMUSCULAR | Status: AC
  Administered 2014-08-13: 10 mg

## 2014-08-13 MED ORDER — NEOMYCIN-POLYMYXIN-HC 3.5-10000-1 OT SOLN: OTIC | Status: DC

## 2014-08-13 MED ORDER — DICLOFENAC SODIUM 75 MG PO TBEC: 75.0000 mg | DELAYED_RELEASE_TABLET | Freq: Two times a day (BID) | ORAL | Status: DC

## 2014-08-13 NOTE — Progress Notes (Signed)
   Subjective:    Patient ID: Ronald Stephenson, male    DOB: 1970-03-17, 44 y.o.   MRN: [id number]  HPI  PT STATED LT BOTTOM ARCH/HEEL HAVING PAIN FOR 4 WEEKS. THE FOOT IS GETTING WORSE ESPECIALLY WHEN WALKING AND FIRST STEP IN THE MORNING. TRIED NO TREATMENT.''  LT FOOT GREAT TOENAIL HAVE DISCOLORATION.''    Review of Systems  All other systems reviewed and are negative.      Objective:   Physical Exam        Assessment & Plan:

## 2014-08-13 NOTE — Patient Instructions (Signed)

## 2014-08-14 NOTE — Progress Notes (Signed)
Subjective:     Patient ID: Ronald Stephenson, male   DOB: 1970-05-25, 44 y.o.   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 Also complains about a damaged left hallux nailbed that's thick yellow odor risks and is loose and can become painful   Review of Systems  All other systems reviewed and are negative.      Objective:   Physical Exam  Constitutional: He is oriented to person, place, and time.  Cardiovascular: Intact distal pulses.   Musculoskeletal: Normal range of motion.  Neurological: He is oriented to person, place, and time.  Skin: Skin is warm.  Nursing note and vitals reviewed.  neurovascular status intact muscle strength adequate with range of motion within normal limits. Patient's noted to have a lot of discomfort plantar aspect heel left over right with fluid buildup noted and moderate depression of the arch. Patient has a damaged left hallux nailbed that's painful when pressed his dad and has been there like this for at least 2 years. Patient has good digital perfusion and is well oriented 3     Assessment:     Acute plantar fasciitis left over right heel and damaged hallux nail left of long-term nature    Plan:     H&P and conditions reviewed and today I discussed nail removal explaining risk. He wants this done and I infiltrated the left hallux 60 Milligan times like Marcaine mixture remove the hallux nail exposed matrix and applied phenol 5 applications 30 seconds followed by alcohol lavage and sterile dressing. I then went ahead and I injected the left plantar fascia 3 mg Kenalog 5 mg Xylocaine and applied fascial brace to lift the arch up patient will be seen back to recheck again in 1 week

## 2014-08-20 ENCOUNTER — Encounter: Payer: Self-pay | Admitting: Podiatry

## 2014-08-20 ENCOUNTER — Ambulatory Visit (INDEPENDENT_AMBULATORY_CARE_PROVIDER_SITE_OTHER): Payer: Federal, State, Local not specified - PPO | Admitting: Podiatry

## 2014-08-20 VITALS — BP 150/88 | HR 62 | Resp 12

## 2014-08-20 DIAGNOSIS — M722 Plantar fascial fibromatosis: Secondary | ICD-10-CM | POA: Diagnosis not present

## 2014-08-20 DIAGNOSIS — L600 Ingrowing nail: Secondary | ICD-10-CM

## 2014-08-22 NOTE — Progress Notes (Signed)
Subjective:     Patient ID: Ronald Stephenson, male   DOB: Jun 21, 1970, 44 y.o.   MRN: [id number]  HPI patient states I'm doing well with the nail in my heel pain is improved but it is still present when palpated   Review of Systems     Objective:   Physical Exam Neurovascular status intact with well-healing nail site left hallux with heel pain which is still sore when pressed medial band at the insertional point into the calcaneus    Assessment:     Plantar fasciitis left still present with moderate depression of the arch and improving ingrown toenail surgery left    Plan:     Reviewed both conditions and advised on continued soaks and for the heel scanned for custom orthotics to reduce pressure against the heel. We utilized a Berkley type device for control of the arch

## 2014-09-15 ENCOUNTER — Ambulatory Visit: Payer: Federal, State, Local not specified - PPO | Admitting: *Deleted

## 2014-09-15 DIAGNOSIS — M722 Plantar fascial fibromatosis: Secondary | ICD-10-CM

## 2014-09-15 NOTE — Patient Instructions (Signed)

## 2014-09-15 NOTE — Progress Notes (Signed)
Patient ID: Ronald Stephenson, male   DOB: 1970/05/28, 44 y.o.   MRN: [id number] Patient presents for orthotic pick up.  Verbal and written break in and wear instructions given.  Patient will follow up in 4 weeks if symptoms worsen or fail to improve.

## 2015-05-17 ENCOUNTER — Ambulatory Visit (INDEPENDENT_AMBULATORY_CARE_PROVIDER_SITE_OTHER): Payer: Federal, State, Local not specified - PPO | Admitting: Internal Medicine

## 2015-05-17 VITALS — BP 118/64 | HR 125 | Temp 102.9°F | Resp 18 | Ht 70.0 in | Wt 214.0 lb

## 2015-05-17 DIAGNOSIS — R509 Fever, unspecified: Secondary | ICD-10-CM

## 2015-05-17 DIAGNOSIS — J111 Influenza due to unidentified influenza virus with other respiratory manifestations: Secondary | ICD-10-CM

## 2015-05-17 LAB — POCT INFLUENZA A/B
Influenza A, POC: NEGATIVE
Influenza B, POC: NEGATIVE

## 2015-05-17 MED ORDER — OSELTAMIVIR PHOSPHATE 75 MG PO CAPS: 75.0000 mg | ORAL_CAPSULE | Freq: Two times a day (BID) | ORAL | Status: DC

## 2015-05-17 MED ORDER — HYDROCODONE-HOMATROPINE 5-1.5 MG/5ML PO SYRP: 5.0000 mL | ORAL_SOLUTION | Freq: Four times a day (QID) | ORAL | Status: DC | PRN

## 2015-05-17 NOTE — Patient Instructions (Signed)
     IF you received an x-ray today, you will receive an invoice from [facility]. Please contact [facility] at [phone] with questions or concerns regarding your invoice.   IF you received labwork today, you will receive an invoice from [facility]. Please contact [facility] at [phone] with questions or concerns regarding your invoice.   Our billing staff will not be able to assist you with questions regarding bills from these companies.  You will be contacted with the lab results as soon as they are available. The fastest way to get your results is to activate your My Chart account. Instructions are located on the last page of this paperwork. If you have not heard from us regarding the results in 2 weeks, please contact this office.      

## 2015-05-17 NOTE — Progress Notes (Signed)
   Subjective:    Patient ID: Ronald Stephenson, male    DOB: [birth year], 89 y.o.   MRN: [id number] By signing my name below, I, [name], attest that this documentation has been prepared under the direction and in the presence of Ellamae Siaobert Rick Warnick, MD.  Electronically Signed: [name], Medical Scribe. 05/17/2015. 6:17 PM.  HPI HPI Comments: Ronald Musteter Searls is a 45 y.o. male with a history of seasonal allergic rhinitis who presents to the [facility] complaining of fever that started last night. His temperature is 102.9 F in the office today. He reports having associated cough, mild nasal drip, and generalized myalgias. Patient denies abdominal pain, sore throat, and rash.No chest pain. No nausea and vomiting. No genitourinary symptoms.  No current medications other than Flonase  Review of Systems Noncontributory    Objective:   Physical Exam  Constitutional: He is oriented to person, place, and time. He appears well-developed and well-nourished. No distress.  HENT:  Head: Normocephalic and atraumatic.  Nose: Nose normal.  Mouth/Throat: Oropharynx is clear and moist. No oropharyngeal exudate.  Nose and throat clear.  Eyes: Pupils are equal, round, and reactive to light.  Neck: Neck supple.  Cardiovascular: Regular rhythm and normal heart sounds.   No murmur heard. Tachycardia  Pulmonary/Chest: Effort normal and breath sounds normal. No respiratory distress. He has no wheezes. He has no rales.  Chest clear.  Musculoskeletal: He exhibits no edema.  Neurological: He is alert and oriented to person, place, and time. No cranial nerve deficit.  Skin: Skin is warm and dry. No rash noted.  Psychiatric: He has a normal mood and affect. His behavior is normal.  Nursing note and vitals reviewed. BP 118/64 mmHg  Pulse 125  Temp(Src) 102.9 F (39.4 C) (Oral)  Resp 18  Ht 5\' 10"  (1.778 m)  Wt 214 lb (97.07 kg)  BMI 30.71 kg/m2  SpO2 96%  Results for orders placed or  performed in visit on 05/17/15  POCT Influenza A/B  Result Value Ref Range   Influenza A, POC Negative Negative   Influenza B, POC Negative Negative          Assessment & Plan:  Acute influenza despite negative test Meds ordered this encounter  Medications  . oseltamivir (TAMIFLU) 75 MG capsule    Sig: Take 1 capsule (75 mg total) by mouth 2 (two) times daily.    Dispense:  10 capsule    Refill:  0  . HYDROcodone-homatropine (HYCODAN) 5-1.5 MG/5ML syrup    Sig: Take 5 mLs by mouth every 6 (six) hours as needed.    Dispense:  120 mL    Refill:  0  Bed rest, fluids and acetaminophen Out of work until afebrile (works at the [employer])  I have completed the patient encounter in its entirety as documented by the scribe, with editing by me where necessary. Charliee Krenz P. Merla Richesoolittle, M.D.

## 2016-04-06 ENCOUNTER — Ambulatory Visit (HOSPITAL_COMMUNITY)
Admission: EM | Admit: 2016-04-06 | Discharge: 2016-04-06 | Disposition: A | Discharge status: Home / Self Care | Payer: Federal, State, Local not specified - PPO | Attending: Family Medicine | Admitting: Family Medicine

## 2016-04-06 ENCOUNTER — Encounter (HOSPITAL_COMMUNITY): Payer: Self-pay | Admitting: Emergency Medicine

## 2016-04-06 DIAGNOSIS — R0789 Other chest pain: Secondary | ICD-10-CM

## 2016-04-06 DIAGNOSIS — R109 Unspecified abdominal pain: Secondary | ICD-10-CM

## 2016-04-06 DIAGNOSIS — J069 Acute upper respiratory infection, unspecified: Secondary | ICD-10-CM

## 2016-04-06 DIAGNOSIS — B9789 Other viral agents as the cause of diseases classified elsewhere: Secondary | ICD-10-CM

## 2016-04-06 MED ORDER — HYDROCODONE-HOMATROPINE 5-1.5 MG/5ML PO SYRP: 5.0000 mL | ORAL_SOLUTION | Freq: Four times a day (QID) | ORAL | 0 refills | Status: DC | PRN

## 2016-04-06 NOTE — Discharge Instructions (Signed)
Hycodan/Hydromet (hydrocodone-homatropine) is a narcotic cough medication. Do not drink alcohol, drive, or take other sedating medications while taking this cough medication.  You may take this medication in the evening every 6 hours as needed for cough and side pain.  During the day, continue to take mucinex with a large glass of water, acetaminophen and ibuprofen to help with pain.  Please use resource guide below to find a primary care provider.   Emergency Department Resource Guide 1) Find a Doctor and Pay Out of Pocket Although you won't have to find out who is covered by your insurance plan, it is a good idea to ask around and get recommendations. You will then need to call the office and see if the doctor you have chosen will accept you as a new patient and what types of options they offer for patients who are self-pay. Some doctors offer discounts or will set up payment plans for their patients who do not have insurance, but you will need to ask so you aren't surprised when you get to your appointment.  2) Contact Your Local Health Department Not all health departments have doctors that can see patients for sick visits, but many do, so it is worth a call to see if yours does. If you don't know where your local health department is, you can check in your phone book. The CDC also has a tool to help you locate your state's health department, and many state websites also have listings of all of their local health departments.  3) Find a Walk-in Clinic If your illness is not likely to be very severe or complicated, you may want to try a walk in clinic. These are popping up all over the country in pharmacies, drugstores, and shopping centers. They're usually staffed by nurse practitioners or physician assistants that have been trained to treat common illnesses and complaints. They're usually fairly quick and inexpensive. However, if you have serious medical issues or chronic medical problems, these  are probably not your best option.  No Primary Care Doctor: Call Health Connect at  [phone] - they can help you locate a primary care doctor that  accepts your insurance, provides certain services, etc. Physician Referral Service- [phone]  Chronic Pain Problems: Organization         Address  Phone   Notes  [facility]  [phone] Patients need to be referred by their primary care doctor.   Medication Assistance: Organization         Address  Phone   Notes  [facility] Medication [facility] [address] [place] [place], [place] [address] [phone] --Must be a resident of [facility] -- Must have NO insurance coverage whatsoever (no Medicaid/ Medicare, etc.) -- The pt. MUST have a primary care doctor that directs their care regularly and follows them in the community   [facility]  [phone]   [employer]  [phone]    Agencies that provide inexpensive medical care: Buyer, retail  Notes  [facility]  [phone]   [facility]    [phone]   [facility] [address] [place], [place] [phone]   [facility] [address] [place] [phone]   Planned Parenthood    [phone]   [facility]    [phone]   [facility]  [address], [place] Phone:  [phone], Fax:  [phone] Hours of Operation:  9 am - 6 pm, M-F.  Also accepts Medicaid/Medicare and self-pay.  [facility]  [address], [place] Phone: [phone], Fax: [phone]. Hours of Operation:  8:30 am - 5:30 pm, M-F.  Also accepts Medicaid and self-pay.  [facility] [place] [address] [place]  Phone: [phone]   [facility] [address] Natasha Bence [place], [place] [phone], Ext. [phone] Mondays & Thursdays: 7-9 AM.  First 15 patients are seen on a first come, first serve basis.    Medicaid-accepting [facility] Providers:  Organization         Address                                                                       Phone                               Notes  [facility] [address], [place] [phone] Also accepts self-pay patients.  [facility] [address] Laurell Josephs [place], [place]  [phone]   [facility] [address], [place] [phone]   [facility] [address], [place] [phone]   Renaye Rakers [address], [place]   [phone] Only accepts [place] Access [place] patients after they have their name applied to their card.   Self-Pay (no insurance) in [facility]:   Organization         Address                                                     Phone               Notes  Sickle Cell Patients, [facility] [address] [place], [place] [phone]   [facility] [address] [place], [place] [phone]   [facility] [place]  [address], [place] [phone]   [facility]/Dr. Osei-Bonsu  [address] [place] or [address], [place] [phone] Phone number for both [place] and [place] locations is the same.  [facility] [address] [place] [phone]   [facility] [address], [place] or [address] [phone]   [facility] [address], [place] [phone], phone; [phone], fax Sees patients 1st and 3rd Saturday of every month.  Must not qualify for public or private  insurance (i.e. Medicaid, Medicare, [place] Health Choice, Veterans' Benefits)  Household income should be no more than 200% of the poverty level The clinic cannot treat you if you are pregnant or think you are pregnant  Sexually transmitted diseases are not treated at the clinic.    Dental Care: Organization         Address                                  Phone                       Notes  [facility] of [facility] [address] [place], [place] [phone] Accepts children up to age 60 who are enrolled in [place] or [place] Health Choice; pregnant women with a Medicaid card; and children who have applied for Medicaid or [place] Health Choice, but were declined, whose parents can pay a reduced fee at time of service.  [facility] of [facility]  [address], [place] [phone] Accepts children up to age 78 who are enrolled in [place] or [place] Health Choice; pregnant women with a Medicaid card; and children who have applied for Medicaid or [place] Health Choice, but were declined, whose parents can pay a reduced fee at time of service.  [facility]  [address] [place], [place] [phone] Patients are seen by appointment only. Walk-ins are not accepted. [facility] will see patients 30 years of age and older. Monday - Tuesday (8am-5pm) Most Wednesdays (8:30-5pm) $30 per visit, cash only  [facility]  [address], [facility] [phone] Patients are seen by appointment only. Walk-ins are not accepted. [facility] will see patients 39 years of age and older. One Wednesday Evening (Monthly: Volunteer Based).  $30 per visit, cash only  [employer]  [phone] for adults; Children under age 15, call Graduate Pediatric Dentistry at [phone]. Children aged 54-14, please call [phone] to request a pediatric application.  Dental  services are provided in all areas of dental care including fillings, crowns and bridges, complete and partial dentures, implants, gum treatment, root canals, and extractions. Preventive care is also provided. Treatment is provided to both adults and children. Patients are selected via a lottery and there is often a waiting list.   [facility] [address], [place]  [phone] [email or web address]   [facility] [address] [place], [place] [phone], Ext. [phone] Second and Fourth Thursday of each month, opens at 6:30 AM; Clinic ends at 9 AM.  Patients are seen on a first-come first-served basis, and a limited number are seen during each clinic.   [facility]  [address] Ether Griffins [place], [place] [phone]   Eligibility Requirements You must have lived in [place], [place], or [place] counties for at least the last three months.   You cannot be eligible for state or federal sponsored [place], including [employer], [place], or [employer].   You generally cannot be eligible for healthcare insurance through your employer.    How to apply: Eligibility screenings are held every  Tuesday and Wednesday afternoon from 1:00 pm until 4:00 pm. You do not need an appointment for the interview!  [facility] [address], [place], [place] [phone]   [facility]  [phone]   [facility]  [phone]   [facility]  [phone]    Behavioral Health Resources in the Community: Intensive Outpatient Programs Organization         Address                                              Phone              Notes  [facility] [address], [place], [place] [phone]   [facility] [address], [place], [place] [phone]   [facility] [address], [place], [place]   [phone]   [facility] [address],  [place], [place] [phone] or [phone]   Substance Abuse Resources Organization         Address                                Phone  Notes  [facility]  [phone]   [facility]  [phone]   [facility]  [phone]   [facility]  [phone]   Residential & Outpatient Substance Abuse Program  [phone]   Psychological Services Organization         Address                                  Phone                Notes  [facility]  [phone]   [facility]  [phone]- [phone]   [facility] [address], [place] [phone] or [phone]    Mobile Crisis Teams Organization         Address  Phone  Notes  [facility], Mobile Crisis Care Unit  [phone]   [facility]  [address] [place], [place] [phone]   Doristine LocksSharon DeEsch [address] [place] [phone]    Self-Help/Support Groups Organization         Address                         Phone             Notes  [facility] [place] - variety of support groups  [phone] Call for more information  Narcotics Anonymous (NA), [facility] [address], [place]  2 meetings at this location   Statisticianesidential Treatment Programs Organization         Address                                                    Phone              Notes  [facility] [phone]  [address]    [place]  [phone]   [facility]  [address] [place] [address], [place], [place] [phone]   [facility] [address] [place], [place] [phone] Admissions: 8am-3pm M-F  [facility] [address]    [place], [place] [phone]   [facility] [address] [place], [place], [place] [phone]   [facility] [address],   [place], [place] [phone]   [facility] - Intensive Outpatient [address] Laurell Josephs [address], [place], [place] [phone]   [facility] ([facility] [address] [place]  [place], [place] [phone] or [phone]   [facility] ([facility]) [address], [place], [place] [phone] Accepts Medicaid  [facility] [address],  [place] [phone] Substance Abuse/Addiction Treatment   [facility] Organization         Address                                                            Phone                    Notes  [facility]  [phone]   Angie Fava, PhD [address] Ervin Knack [place], [place]   [phone] or [phone]   [facility]   [address] [place], [place] [phone]   [facility] [address] [place], [place] [phone] Insurance/Medicaid/sponsorship through [facility] and Families [address]                                    [place], [place] [phone] Therapy/tele-psych/case  [facility] [address], [place] [phone]    Dr. Lolly Mustache  [phone]   [facility]  [employer] [place] Dept. 1) [address] [place] 2) [address] [place] 3)  [address], [place] [phone]  [phone]   [facility] Child Abuse Hotline [phone] or [phone] (After Hours)

## 2016-04-06 NOTE — ED Triage Notes (Signed)
Here for left flank pain onset yest  Thought it was due to cough a lot... Got better but this am he sneezed and sx were aggravated.   Denies urinary sx or BM problems  States certain movements increases pain  A&O x4... NAD

## 2016-04-06 NOTE — ED Provider Notes (Signed)
CSN: [id number]     Arrival date & time 04/06/16  1047 History   First MD Initiated Contact with Patient 04/06/16 1139     Chief Complaint  Patient presents with  . Flank Pain   (Consider location/radiation/quality/duration/timing/severity/associated sxs/prior Treatment) HPI  Ronald Stephenson is a 46 y.o. male presenting to UC with c/o intermittent Left sided flank pain that started after he coughed yesterday. Pt reports having a mildly productive cough for the last 2-3 days but denies fever, chills, n/v/d. His daughter had a cold last week. No other known sick contacts.  Denies chest pain at this time. Left side chest/flank pain is worse with palpation and certain movements. No hx of kidney stones. Denies blood in urine or dysuria.    Past Medical History:  Diagnosis Date  . Allergy    Past Surgical History:  Procedure Laterality Date  . WISDOM TOOTH EXTRACTION     Family History  Problem Relation Age of Onset  . Emphysema Maternal Grandmother   . Cancer Maternal Grandfather     bone cancer  . Heart disease Paternal Grandmother   . Heart disease Father   . Hypertension Father   . Asthma Sister    Social History  Substance Use Topics  . Smoking status: Never Smoker  . Smokeless tobacco: Never Used  . Alcohol use Yes     Comment: social    Review of Systems  Constitutional: Negative for chills and fever.  HENT: Negative for congestion, ear pain, sore throat, trouble swallowing and voice change.   Respiratory: Positive for cough. Negative for shortness of breath.   Cardiovascular: Positive for chest pain (Left side). Negative for palpitations.  Gastrointestinal: Negative for abdominal pain, diarrhea, nausea and vomiting.  Genitourinary: Positive for flank pain (Left). Negative for dysuria and hematuria.  Musculoskeletal: Negative for arthralgias, back pain and myalgias.  Skin: Negative for rash.    Allergies  Erythromycin  Home Medications   Prior to Admission  medications   Medication Sig Start Date End Date Taking? Authorizing Provider  diclofenac (VOLTAREN) 75 MG EC tablet Take 1 tablet (75 mg total) by mouth 2 (two) times daily. Patient not taking: Reported on 05/17/2015 08/13/14   Kirstie Peri Regal, DPM  fluticasone (FLONASE) 50 MCG/ACT nasal spray Place 1 spray into both nostrils daily.    Historical Provider, MD  fluticasone (FLOVENT HFA) 110 MCG/ACT inhaler INHALE TWO BY MOUTH TWICE DAILY THEN RINSE MOUTH Patient not taking: Reported on 05/17/2015 10/19/13   Waymon Budge, MD  HYDROcodone-homatropine (HYDROMET) 5-1.5 MG/5ML syrup Take 5 mLs by mouth every 6 (six) hours as needed for cough. 04/06/16   Junius Finner, PA-C  loratadine (CLARITIN) 10 MG tablet Take 10 mg by mouth daily. Reported on 05/17/2015    Historical Provider, MD  oseltamivir (TAMIFLU) 75 MG capsule Take 1 capsule (75 mg total) by mouth 2 (two) times daily. 05/17/15   Tonye Pearson, MD   Meds Ordered and Administered this Visit  Medications - No data to display  BP 143/87 (BP Location: Left Arm)   Pulse 69   Temp 98.3 F (36.8 C) (Oral)   Resp 20   SpO2 96%  No data found.   Physical Exam  Constitutional: He is oriented to person, place, and time. He appears well-developed and well-nourished. No distress.  HENT:  Head: Normocephalic and atraumatic.  Right Ear: Tympanic membrane normal.  Left Ear: Tympanic membrane normal.  Nose: Nose normal.  Mouth/Throat: Uvula is midline, oropharynx is clear  and moist and mucous membranes are normal.  Eyes: EOM are normal.  Neck: Normal range of motion. Neck supple.  Cardiovascular: Normal rate and regular rhythm.   Pulmonary/Chest: Effort normal and breath sounds normal. No stridor. No respiratory distress. He has no wheezes. He has no rales.     He exhibits tenderness.    Lungs: CTAB. Tenderness to Left side chest wall w/o deformity or crepitus  Musculoskeletal: Normal range of motion.  Lymphadenopathy:    He has no  cervical adenopathy.  Neurological: He is alert and oriented to person, place, and time.  Skin: Skin is warm and dry. He is not diaphoretic.  Psychiatric: He has a normal mood and affect. His behavior is normal.  Nursing note and vitals reviewed.   Urgent Care Course     Procedures (including critical care time)  Labs Review Labs Reviewed - No data to display  Imaging Review No results found.   MDM   1. Left flank pain   2. Viral URI with cough   3. Left-sided chest wall pain    Hx and exam most c/w viral URI and pulled muscle. Doubt pneumonia due to lack of fever. Symptoms of cough and congestion are mild for pt.    Rx: hycodan for at night. May continue OTC mucinex, take acetaminophen and ibuprofen during the day for pain F/u with PCP next week if not improving, resource guide provided.    Junius Finnerrin O'Malley, PA-C 04/06/16 1202

## 2016-04-13 ENCOUNTER — Telehealth: Payer: Self-pay | Admitting: Internal Medicine

## 2016-04-13 NOTE — Telephone Encounter (Signed)
Called and spoke to pt. Pt is requesting a refill of Flovent hfa, pt states he only uses this prn. Pt has not been seen since 2016 and does not want an OV at this time.   Dr. Maple HudsonYoung please advise. Thanks.   Allergies  Allergen Reactions  . Erythromycin Nausea And Vomiting    Current Outpatient Prescriptions on File Prior to Visit  Medication Sig Dispense Refill  . diclofenac (VOLTAREN) 75 MG EC tablet Take 1 tablet (75 mg total) by mouth 2 (two) times daily. (Patient not taking: Reported on 05/17/2015) 50 tablet 2  . fluticasone (FLONASE) 50 MCG/ACT nasal spray Place 1 spray into both nostrils daily.    . fluticasone (FLOVENT HFA) 110 MCG/ACT inhaler INHALE TWO BY MOUTH TWICE DAILY THEN RINSE MOUTH (Patient not taking: Reported on 05/17/2015) 12 g 12  . HYDROcodone-homatropine (HYDROMET) 5-1.5 MG/5ML syrup Take 5 mLs by mouth every 6 (six) hours as needed for cough. 60 mL 0  . loratadine (CLARITIN) 10 MG tablet Take 10 mg by mouth daily. Reported on 05/17/2015     No current facility-administered medications on file prior to visit.

## 2016-04-15 NOTE — Telephone Encounter (Signed)
We can fill this time with one refill. Remind him that after 3 years w/o office visit, he will be considered an new patient.  He may do better to ask his PCP to refill this med.

## 2016-04-16 NOTE — Telephone Encounter (Signed)
lmtcb x1 for pt. 

## 2016-04-18 NOTE — Telephone Encounter (Signed)
lmomtcb x 2  

## 2016-04-19 NOTE — Telephone Encounter (Signed)
LM x3  

## 2016-04-20 NOTE — Telephone Encounter (Signed)
Three attempts has been made to this pt but no response. Will close message per triage protocol.

## 2016-05-02 DIAGNOSIS — K080 Exfoliation of teeth due to systemic causes: Secondary | ICD-10-CM | POA: Diagnosis not present

## 2016-11-06 DIAGNOSIS — K080 Exfoliation of teeth due to systemic causes: Secondary | ICD-10-CM | POA: Diagnosis not present

## 2017-01-07 DIAGNOSIS — M25512 Pain in left shoulder: Secondary | ICD-10-CM | POA: Diagnosis not present

## 2017-01-07 DIAGNOSIS — M25511 Pain in right shoulder: Secondary | ICD-10-CM | POA: Diagnosis not present

## 2017-01-07 DIAGNOSIS — M24811 Other specific joint derangements of right shoulder, not elsewhere classified: Secondary | ICD-10-CM | POA: Diagnosis not present

## 2017-01-07 DIAGNOSIS — M24812 Other specific joint derangements of left shoulder, not elsewhere classified: Secondary | ICD-10-CM | POA: Diagnosis not present

## 2017-01-21 DIAGNOSIS — M25512 Pain in left shoulder: Secondary | ICD-10-CM | POA: Diagnosis not present

## 2017-05-07 DIAGNOSIS — K080 Exfoliation of teeth due to systemic causes: Secondary | ICD-10-CM | POA: Diagnosis not present

## 2017-10-03 ENCOUNTER — Emergency Department (INDEPENDENT_AMBULATORY_CARE_PROVIDER_SITE_OTHER)
Admission: EM | Admit: 2017-10-03 | Discharge: 2017-10-03 | Disposition: A | Discharge status: Home / Self Care | Payer: Federal, State, Local not specified - PPO | Source: Home / Self Care | Attending: Family Medicine | Admitting: Family Medicine

## 2017-10-03 ENCOUNTER — Other Ambulatory Visit: Payer: Self-pay

## 2017-10-03 ENCOUNTER — Encounter: Payer: Self-pay | Admitting: *Deleted

## 2017-10-03 DIAGNOSIS — R053 Chronic cough: Secondary | ICD-10-CM

## 2017-10-03 DIAGNOSIS — R062 Wheezing: Secondary | ICD-10-CM

## 2017-10-03 DIAGNOSIS — R05 Cough: Secondary | ICD-10-CM | POA: Diagnosis not present

## 2017-10-03 MED ORDER — ALBUTEROL SULFATE HFA 108 (90 BASE) MCG/ACT IN AERS: 1.0000 | INHALATION_SPRAY | Freq: Four times a day (QID) | RESPIRATORY_TRACT | 0 refills | Status: AC | PRN

## 2017-10-03 MED ORDER — FLUTICASONE-SALMETEROL 100-50 MCG/DOSE IN AEPB: 1.0000 | INHALATION_SPRAY | Freq: Two times a day (BID) | RESPIRATORY_TRACT | 0 refills | Status: DC

## 2017-10-03 MED ORDER — AZITHROMYCIN 250 MG PO TABS: 250.0000 mg | ORAL_TABLET | Freq: Every day | ORAL | 0 refills | Status: DC

## 2017-10-03 NOTE — ED Provider Notes (Signed)
[facility]    CSN: [id number] Arrival date & time: 10/03/17  1556     History   Chief Complaint Chief Complaint  Patient presents with  . Cough  . Wheezing    HPI Ronald Stephenson is a 47 y.o. male.   HPI Ronald Stephenson is a 47 y.o. male presenting to UC with c/o 3 weeks of nasal congestion and cough.  Cough is minimally productive with associated wheeze at times. He has been prescribed Advair and Albuterol in the past but his inhalers are now expired. Denies fever, chills, n/v/d. Denies known sick contacts. No chest pain or SOB at this time.    Past Medical History:  Diagnosis Date  . Allergy     Patient Active Problem List   Diagnosis Date Noted  . Allergic-infective asthma 04/24/2012  . Seasonal allergic rhinitis 04/24/2012    Past Surgical History:  Procedure Laterality Date  . WISDOM TOOTH EXTRACTION         Home Medications    Prior to Admission medications   Medication Sig Start Date End Date Taking? Authorizing Provider  albuterol (PROVENTIL HFA;VENTOLIN HFA) 108 (90 Base) MCG/ACT inhaler Inhale 1-2 puffs into the lungs every 6 (six) hours as needed for wheezing or shortness of breath. 10/03/17   Lurene ShadowPhelps, Luisa Louk O, PA-C  azithromycin (ZITHROMAX) 250 MG tablet Take 1 tablet (250 mg total) by mouth daily. Take first 2 tablets together, then 1 every day until finished. 10/03/17   Lurene ShadowPhelps, Alexis Mizuno O, PA-C  fluticasone (FLONASE) 50 MCG/ACT nasal spray Place 1 spray into both nostrils daily.    [provider]  Fluticasone-Salmeterol (ADVAIR DISKUS) 100-50 MCG/DOSE AEPB Inhale 1 puff into the lungs 2 (two) times daily. 10/03/17   Lurene ShadowPhelps, Bradie Sangiovanni O, PA-C    Family History Family History  Problem Relation Age of Onset  . Emphysema Maternal Grandmother   . Cancer Maternal Grandfather        bone cancer  . Heart disease Paternal Grandmother   . Heart disease Father   . Hypertension Father   . Asthma Sister     Social History Social History    Tobacco Use  . Smoking status: Never Smoker  . Smokeless tobacco: Never Used  Substance Use Topics  . Alcohol use: Yes    Comment: social  . Drug use: Not on file     Allergies   Erythromycin   Review of Systems Review of Systems  Constitutional: Negative for chills and fever.  HENT: Positive for congestion. Negative for ear pain, sore throat, trouble swallowing and voice change.   Respiratory: Positive for cough and wheezing. Negative for shortness of breath.   Cardiovascular: Negative for chest pain and palpitations.  Gastrointestinal: Negative for abdominal pain, diarrhea, nausea and vomiting.  Musculoskeletal: Negative for arthralgias, back pain and myalgias.  Skin: Negative for rash.     Physical Exam Triage Vital Signs ED Triage Vitals  Enc Vitals Group     BP 10/03/17 1609 (!) 149/89     Pulse Rate 10/03/17 1609 73     Resp 10/03/17 1609 16     Temp 10/03/17 1609 98.3 F (36.8 C)     Temp Source 10/03/17 1609 Oral     SpO2 10/03/17 1609 97 %     Weight 10/03/17 1610 220 lb (99.8 kg)     Height 10/03/17 1610 5\' 9"  (1.753 m)     Head Circumference --      Peak Flow --  Pain Score 10/03/17 1610 0     Pain Loc --      Pain Edu? --      Excl. in GC? --    No data found.  Updated Vital Signs BP (!) 149/89 (BP Location: Right Arm)   Pulse 73   Temp 98.3 F (36.8 C) (Oral)   Resp 16   Ht 5\' 9"  (1.753 m)   Wt 220 lb (99.8 kg)   SpO2 97%   BMI 32.49 kg/m   Visual Acuity Right Eye Distance:   Left Eye Distance:   Bilateral Distance:    Right Eye Near:   Left Eye Near:    Bilateral Near:     Physical Exam  Constitutional: He is oriented to person, place, and time. He appears well-developed and well-nourished. No distress.  HENT:  Head: Normocephalic and atraumatic.  Right Ear: Tympanic membrane normal.  Left Ear: Tympanic membrane normal.  Nose: Nose normal. Right sinus exhibits no maxillary sinus tenderness and no frontal sinus  tenderness. Left sinus exhibits no maxillary sinus tenderness and no frontal sinus tenderness.  Mouth/Throat: Uvula is midline, oropharynx is clear and moist and mucous membranes are normal.  Eyes: EOM are normal.  Neck: Normal range of motion. Neck supple.  Cardiovascular: Normal rate and regular rhythm.  Pulmonary/Chest: Effort normal and breath sounds normal. No stridor. No respiratory distress. He has no wheezes. He has no rales.  Occasional dry hacking cough, no respiratory distress  Musculoskeletal: Normal range of motion.  Neurological: He is alert and oriented to person, place, and time.  Skin: Skin is warm and dry. He is not diaphoretic.  Psychiatric: He has a normal mood and affect. His behavior is normal.  Nursing note and vitals reviewed.    UC Treatments / Results  Labs (all labs ordered are listed, but only abnormal results are displayed) Labs Reviewed - No data to display  EKG None  Radiology No results found.  Procedures Procedures (including critical care time)  Medications Ordered in UC Medications - No data to display  Initial Impression / Assessment and Plan / UC Course  I have reviewed the triage vital signs and the nursing notes.  Pertinent labs & imaging results that were available during my care of the patient were reviewed by me and considered in my medical decision making (see chart for details).     Will cover for atypical bacterial infection due to duration of symptoms.  Final Clinical Impressions(s) / UC Diagnoses   Final diagnoses:  Persistent cough for 3 weeks or longer  Wheeze     Discharge Instructions      Please take the medications as prescribed and follow up with your family doctor in 1 week if not improving.     ED Prescriptions    Medication Sig Dispense Auth. Provider   azithromycin (ZITHROMAX) 250 MG tablet Take 1 tablet (250 mg total) by mouth daily. Take first 2 tablets together, then 1 every day until finished. 6  tablet Doroteo GlassmanPhelps, Suezette Lafave O, PA-C   albuterol (PROVENTIL HFA;VENTOLIN HFA) 108 (90 Base) MCG/ACT inhaler Inhale 1-2 puffs into the lungs every 6 (six) hours as needed for wheezing or shortness of breath. 1 Inhaler Doroteo GlassmanPhelps, Zaeda Mcferran O, PA-C   Fluticasone-Salmeterol (ADVAIR DISKUS) 100-50 MCG/DOSE AEPB Inhale 1 puff into the lungs 2 (two) times daily. 60 each Lurene ShadowPhelps, Severin Bou O, PA-C     Controlled Substance Prescriptions [place] Controlled Substance Registry consulted? Not Applicable   Rolla Platehelps, Scottie Metayer O, PA-C 10/03/17 1644

## 2017-10-03 NOTE — Discharge Instructions (Signed)
°  Please take the medications as prescribed and follow up with your family doctor in 1 week if not improving.

## 2017-10-03 NOTE — ED Triage Notes (Signed)
Patient c/o 3 weeks of a cold. Still has lingering persistent cough, productive at times with wheezing. Prescribed inhaler in the past for possible asthma/allergy flares. Afebrile.

## 2017-10-11 ENCOUNTER — Ambulatory Visit: Payer: Federal, State, Local not specified - PPO | Admitting: Emergency Medicine

## 2017-10-11 ENCOUNTER — Encounter: Payer: Self-pay | Admitting: Emergency Medicine

## 2017-10-11 VITALS — BP 142/74 | HR 87 | Ht 69.0 in | Wt 222.6 lb

## 2017-10-11 DIAGNOSIS — J45998 Other asthma: Secondary | ICD-10-CM | POA: Diagnosis not present

## 2017-10-11 DIAGNOSIS — R0683 Snoring: Secondary | ICD-10-CM

## 2017-10-11 DIAGNOSIS — G4733 Obstructive sleep apnea (adult) (pediatric): Secondary | ICD-10-CM | POA: Insufficient documentation

## 2017-10-11 NOTE — Assessment & Plan Note (Signed)
Patient with a history of mild intermittent asthma.  For the last several years is only been active and has only required maintenance medication when he has an upper respiratory infection and is flaring.  He is used Flovent at these times.  He formally has been on Advair several years ago.  He had a recent upper respiratory infection and exacerbation was started on wixela.  His only residual symptom now is cough.  I do not hear any wheezing on exam. I like to stop the powdered inhaled medication to see if his cough improves.  He will continue his albuterol with only as needed.  We will repeat his pulmonary function testing and compare with priors.  Based on his PFT and his clinical improvement we will decide whether he needs to be on maintenance asthma therapy.  We will try an ICS if it was indicated.  Otherwise he can use this only during times of exacerbation as he has done when he had URI in the past.

## 2017-10-11 NOTE — Patient Instructions (Addendum)
Please stop Wixela now Keep albuterol available to use 2 puffs if needed for coughing, chest tightness, wheezing, shortness of breath. Agree with using your fluticasone nasal spray during the spring months. We will repeat your pulmonary function testing to compare with 2014. Depending on your symptoms and your pulmonary testing we will decide which scheduled medicines to use for your asthma. Keep track of how much you are snoring and your daytime sleepiness.  We may decide to pursue sleep testing at some point in the future. Please get your flu shot in the fall Follow with Dr Delton CoombesByrum next available with Full PFT

## 2017-10-11 NOTE — Assessment & Plan Note (Signed)
Snoring that has been more noticeable per his wife recently.  Unclear clinical relevance.  He does not have much daytime sleepiness.  We will follow him for any progression of hypersomnolence or any other clinical change.  If so then we would pursue a sleep study.

## 2017-10-11 NOTE — Progress Notes (Signed)
Subjective:    Patient ID: Ronald Stephenson, male    DOB: 08/08/1970, 47 y.o.   MRN: [id number]  HPI 47 year old gentleman, never smoker, with a history of allergic rhinitis and mild intermittent asthma followed here by Dr. Maple Hudson in the past.  Pulmonary function testing from 07/01/2012 was reviewed and shows moderate to moderately severe mixed obstruction and restriction with normal lung volumes and normal diffusion capacity.  There is a partial response to bronchodilator but this does not meet clinical significance.  He was formally managed on Advair, but has been off scheduled meds for over 4 years. He did use some flovent during times when he had URI. Rare SABA use.  His allergy regimen has been fluticasone NS during the Spring months.   He has been fairly well over the last several years, but has had a recent URI and was treated at urgent care with azithro, started on Wixela (powder, generic advair). May have helped his wheeze some. Still experiencing some cough. Non-productive, occasionally clear mucous. No SABA use - ran out of it.    Review of Systems  Constitutional: Negative for fever and unexpected weight change.  HENT: Positive for congestion and sneezing. Negative for dental problem, ear pain, nosebleeds, postnasal drip, rhinorrhea, sinus pressure, sore throat and trouble swallowing.   Eyes: Negative for redness and itching.  Respiratory: Positive for cough and shortness of breath. Negative for chest tightness and wheezing.   Cardiovascular: Negative for palpitations and leg swelling.  Gastrointestinal: Negative for nausea and vomiting.  Genitourinary: Negative for dysuria.  Musculoskeletal: Negative for joint swelling.  Skin: Negative for rash.  Neurological: Negative for headaches.  Hematological: Does not bruise/bleed easily.  Psychiatric/Behavioral: Negative for dysphoric mood. The patient is not nervous/anxious.    Past Medical History:  Diagnosis Date  . Allergy       Family History  Problem Relation Age of Onset  . Emphysema Maternal Grandmother   . Cancer Maternal Grandfather        bone cancer  . Heart disease Paternal Grandmother   . Heart disease Father   . Hypertension Father   . Asthma Sister      Social History   Socioeconomic History  . Marital status: Married    Spouse name: Not on file  . Number of children: Not on file  . Years of education: Not on file  . Highest education level: Not on file  Occupational History  . Not on file  Social Needs  . Financial resource strain: Not on file  . Food insecurity:    Worry: Not on file    Inability: Not on file  . Transportation needs:    Medical: Not on file    Non-medical: Not on file  Tobacco Use  . Smoking status: Never Smoker  . Smokeless tobacco: Never Used  Substance and Sexual Activity  . Alcohol use: Yes    Comment: social  . Drug use: Not on file  . Sexual activity: Not on file  Lifestyle  . Physical activity:    Days per week: Not on file    Minutes per session: Not on file  . Stress: Not on file  Relationships  . Social connections:    Talks on phone: Not on file    Gets together: Not on file    Attends religious service: Not on file    Active member of club or organization: Not on file    Attends meetings of clubs or organizations: Not on file  Relationship status: Not on file  . Intimate partner violence:    Fear of current or ex partner: Not on file    Emotionally abused: Not on file    Physically abused: Not on file    Forced sexual activity: Not on file  Other Topics Concern  . Not on file  Social History Narrative  . Not on file     Allergies  Allergen Reactions  . Erythromycin Nausea And Vomiting     Outpatient Medications Prior to Visit  Medication Sig Dispense Refill  . albuterol (PROVENTIL HFA;VENTOLIN HFA) 108 (90 Base) MCG/ACT inhaler Inhale 1-2 puffs into the lungs every 6 (six) hours as needed for wheezing or shortness of breath.  1 Inhaler 0  . fluticasone (FLONASE) 50 MCG/ACT nasal spray Place 1 spray into both nostrils daily.    . Fluticasone-Salmeterol (WIXELA INHUB) 100-50 MCG/DOSE AEPB Inhale 1 puff into the lungs 2 (two) times daily.    Marland Kitchen. azithromycin (ZITHROMAX) 250 MG tablet Take 1 tablet (250 mg total) by mouth daily. Take first 2 tablets together, then 1 every day until finished. 6 tablet 0  . Fluticasone-Salmeterol (ADVAIR DISKUS) 100-50 MCG/DOSE AEPB Inhale 1 puff into the lungs 2 (two) times daily. 60 each 0   No facility-administered medications prior to visit.         Objective:   Physical Exam Vitals:   10/11/17 1435  BP: (!) 142/74  Pulse: 87  SpO2: 99%  Weight: 222 lb 9.6 oz (101 kg)  Height: 5\' 9"  (1.753 m)   Gen: Pleasant, well-nourished, in no distress,  normal affect  ENT: No lesions,  mouth clear,  oropharynx clear, narrow posterior pharynx, no postnasal drip  Neck: No JVD, no stridor  Lungs: No use of accessory muscles, clear B, no wheeze on a forced exp  Cardiovascular: RRR, heart sounds normal, no murmur or gallops, no peripheral edema  Musculoskeletal: No deformities, no cyanosis or clubbing  Neuro: alert, non focal  Skin: Warm, no lesions or rash     Assessment & Plan:  Allergic-infective asthma Patient with a history of mild intermittent asthma.  For the last several years is only been active and has only required maintenance medication when he has an upper respiratory infection and is flaring.  He is used Flovent at these times.  He formally has been on Advair several years ago.  He had a recent upper respiratory infection and exacerbation was started on wixela.  His only residual symptom now is cough.  I do not hear any wheezing on exam. I like to stop the powdered inhaled medication to see if his cough improves.  He will continue his albuterol with only as needed.  We will repeat his pulmonary function testing and compare with priors.  Based on his PFT and his clinical  improvement we will decide whether he needs to be on maintenance asthma therapy.  We will try an ICS if it was indicated.  Otherwise he can use this only during times of exacerbation as he has done when he had URI in the past.  Snoring Snoring that has been more noticeable per his wife recently.  Unclear clinical relevance.  He does not have much daytime sleepiness.  We will follow him for any progression of hypersomnolence or any other clinical change.  If so then we would pursue a sleep study.  Levy Pupaobert Macintyre Alexa, MD, PhD 10/11/2017, 3:01 PM [facility] [phone] or if no answer [phone]

## 2017-10-31 ENCOUNTER — Ambulatory Visit (INDEPENDENT_AMBULATORY_CARE_PROVIDER_SITE_OTHER): Payer: Federal, State, Local not specified - PPO | Admitting: Pulmonary Disease

## 2017-10-31 ENCOUNTER — Encounter: Payer: Self-pay | Admitting: Pulmonary Disease

## 2017-10-31 ENCOUNTER — Ambulatory Visit: Payer: Federal, State, Local not specified - PPO | Admitting: Pulmonary Disease

## 2017-10-31 VITALS — BP 136/78 | HR 91 | Ht 67.1 in | Wt 218.0 lb

## 2017-10-31 DIAGNOSIS — J45998 Other asthma: Secondary | ICD-10-CM | POA: Diagnosis not present

## 2017-10-31 DIAGNOSIS — R0683 Snoring: Secondary | ICD-10-CM | POA: Diagnosis not present

## 2017-10-31 DIAGNOSIS — Z23 Encounter for immunization: Secondary | ICD-10-CM

## 2017-10-31 DIAGNOSIS — J302 Other seasonal allergic rhinitis: Secondary | ICD-10-CM

## 2017-10-31 LAB — PULMONARY FUNCTION TEST
DL/VA % pred: 128 %
DL/VA: 5.74 ml/min/mmHg/L
DLCO UNC: 34.24 ml/min/mmHg
DLCO unc % pred: 120 %
FEF 25-75 POST: 3.13 L/s
FEF 25-75 PRE: 2.22 L/s
FEF2575-%Change-Post: 40 %
FEF2575-%PRED-POST: 93 %
FEF2575-%Pred-Pre: 66 %
FEV1-%Change-Post: 11 %
FEV1-%PRED-POST: 89 %
FEV1-%PRED-PRE: 79 %
FEV1-POST: 3.27 L
FEV1-Pre: 2.92 L
FEV1FVC-%Change-Post: 8 %
FEV1FVC-%PRED-PRE: 93 %
FEV6-%CHANGE-POST: 3 %
FEV6-%PRED-POST: 92 %
FEV6-%Pred-Pre: 89 %
FEV6-POST: 4.15 L
FEV6-Pre: 4.01 L
FEV6FVC-%PRED-POST: 103 %
FEV6FVC-%Pred-Pre: 103 %
FVC-%CHANGE-POST: 3 %
FVC-%PRED-POST: 89 %
FVC-%Pred-Pre: 86 %
FVC-Post: 4.15 L
FVC-Pre: 4.01 L
POST FEV6/FVC RATIO: 100 %
PRE FEV1/FVC RATIO: 73 %
Post FEV1/FVC ratio: 79 %
Pre FEV6/FVC Ratio: 100 %
RV % PRED: 134 %
RV: 2.44 L
TLC % PRED: 102 %
TLC: 6.51 L

## 2017-10-31 LAB — NITRIC OXIDE: Nitric Oxide: 41

## 2017-10-31 MED ORDER — FLUTICASONE PROPIONATE HFA 110 MCG/ACT IN AERO: 2.0000 | INHALATION_SPRAY | Freq: Two times a day (BID) | RESPIRATORY_TRACT | 12 refills | Status: AC

## 2017-10-31 MED ORDER — MONTELUKAST SODIUM 10 MG PO TABS: 10.0000 mg | ORAL_TABLET | Freq: Every day | ORAL | 11 refills | Status: DC

## 2017-10-31 NOTE — Assessment & Plan Note (Signed)
Will reorder home sleep study today

## 2017-10-31 NOTE — Assessment & Plan Note (Signed)
We will add Singulair daily

## 2017-10-31 NOTE — Progress Notes (Signed)
Elevated FENO in office today - 41 (normal is less than 25). Will start flovent 110 and add daily montelukast 10mg  daily. Follow up in 3 months.   No changes in plan of care.  Contact our office if you have worsening shortness of breath, after use your rescue inhaler more often than usual, or have concerns regarding your respiratory status.  Elisha HeadlandBrian Mack, FNP

## 2017-10-31 NOTE — Progress Notes (Signed)
PFT completed today. 10/31/17  

## 2017-10-31 NOTE — Progress Notes (Signed)
@Patient  ID: Ronald Stephenson, male    DOB: 1970-09-14, 47 y.o.   MRN: [id number]  Chief Complaint  Patient presents with  . Follow-up    PFT today    Referring provider: No ref. provider found  HPI:  47 year old never smoker followed in our office for allergic rhinitis, mild intermittent asthma  Smoker/ Smoking History: never smoker Maintenance:  None  Pt of: Dr. Delton CoombesByrum  Recent [facility] Encounters:   10/11/2017-office visit- Byrum Plan: ICS, home sleep study, PFT  10/31/2017  - Visit   47 year old male patient presented to our office today after completing pulmonary function testing.  PFT today showing ratio of 73, FEV1 of 79, bronchodilator response and mid flow reversibility, TLC 102, RV/TLC-129, DLCO 120, concavity and flow volume loops.  Patient requesting today if to be possible for him to restart Flovent.  As he reports that he is having increased wheezing shortness of breath whenever he is getting sicker recovering from upper respiratory infections.  Patient also denies ever completing home sleep study after last office visit.  Patient also reporting that he is using his rescue inhaler about 3-4 times a week.  Patient reporting that he has increased congestion today, wheezing when laying flat, occasional shortness of breath.  Patient reports he is recovering from a cold at this point in time.  Patient is requesting flu vaccine today.   Tests:   07/01/2012-pulmonary function test- ratio 72, FEV1 68, bronchodilator response in mid flows,'s indicative of small airways disease, Mild to moderate obstructive airways disease with response to bronchodilator, normal lung volumes, normal diffusion capacity slight concavity and flow volume loops  11/30/2011-CT chest without contrast- both lungs are clear, no suspicious pulmonary nodules or masses identified  10/31/2017-pulmonary function test- ratio 79, FVC 89% predicted, FEV1 89, positive bronchodilator response in mid flows,  DLCO 120, slight concavity and flow volume loops  Chart Review:  10/03/2017-ER visit-persistent cough for 3 weeks or longer    Specialty Problems      Pulmonary Problems   Allergic-infective asthma    PFT 07/01/2012-mild to moderate obstructive airways disease with response to bronchodilator, normal lung volumes, normal diffusion capacity. FVC 3.93/77%, FEV1 3.04/75%, FEV1/FVC 0.77, FEF 25-75% 2.66/70%. TLC 85%, DLCO 104%  10/31/2017-pulmonary function test- ratio 79, FVC 89% predicted, FEV1 89, positive bronchodilator response in mid flows, DLCO 120, slight concavity and flow volume loops  10/31/17 - FENO - 41        Seasonal allergic rhinitis   Snoring      Allergies  Allergen Reactions  . Erythromycin Nausea And Vomiting    Immunization History  Administered Date(s) Administered  . Influenza Split 11/19/2011, 11/17/2012  . Influenza,inj,Quad PF,6+ Mos 10/31/2017  . Influenza-Unspecified 11/05/2013   Patient received flu vaccine today  Past Medical History:  Diagnosis Date  . Allergy     Tobacco History: Social History   Tobacco Use  Smoking Status Never Smoker  Smokeless Tobacco Never Used   Counseling given: Not Answered Continue not smoking  Outpatient Encounter Medications as of 10/31/2017  Medication Sig  . albuterol (PROVENTIL HFA;VENTOLIN HFA) 108 (90 Base) MCG/ACT inhaler Inhale 1-2 puffs into the lungs every 6 (six) hours as needed for wheezing or shortness of breath.  . MULTIPLE VITAMIN PO Take 1 tablet by mouth daily.  . fluticasone (FLONASE) 50 MCG/ACT nasal spray Place 1 spray into both nostrils daily.  . fluticasone (FLOVENT HFA) 110 MCG/ACT inhaler Inhale 2 puffs into the lungs 2 (two) times daily.  .Marland Kitchen  montelukast (SINGULAIR) 10 MG tablet Take 1 tablet (10 mg total) by mouth at bedtime.  . [DISCONTINUED] Fluticasone-Salmeterol (WIXELA INHUB) 100-50 MCG/DOSE AEPB Inhale 1 puff into the lungs 2 (two) times daily.   No facility-administered  encounter medications on file as of 10/31/2017.      Review of Systems  Review of Systems  Constitutional: Positive for fatigue. Negative for activity change, chills, fever and unexpected weight change.  HENT: Positive for congestion and postnasal drip. Negative for rhinorrhea, sinus pressure, sinus pain, sneezing and sore throat.   Eyes: Negative.   Respiratory: Positive for cough (cough more at night, dry cough ), shortness of breath (occasionally ) and wheezing.   Cardiovascular: Negative for chest pain and palpitations.  Gastrointestinal: Negative for constipation, diarrhea, nausea and vomiting.       Denies indigestion   Endocrine: Negative.   Musculoskeletal: Negative.   Skin: Negative.   Neurological: Negative for dizziness and headaches.  Psychiatric/Behavioral: Negative.  Negative for dysphoric mood. The patient is not nervous/anxious.   All other systems reviewed and are negative.    Physical Exam  BP 136/78 (BP Location: Left Arm, Cuff Size: Normal)   Pulse 91   Ht 5' 7.1" (1.704 m)   Wt 218 lb (98.9 kg)   SpO2 96%   BMI 34.04 kg/m   Wt Readings from Last 5 Encounters:  10/31/17 218 lb (98.9 kg)  10/11/17 222 lb 9.6 oz (101 kg)  10/03/17 220 lb (99.8 kg)  05/17/15 214 lb (97.1 kg)  05/06/14 212 lb 3.2 oz (96.3 kg)     Physical Exam  Constitutional: He is oriented to person, place, and time and well-developed, well-nourished, and in no distress. No distress.  HENT:  Head: Normocephalic and atraumatic.  Right Ear: Hearing, tympanic membrane, external ear and ear canal normal.  Left Ear: Hearing, tympanic membrane, external ear and ear canal normal.  Nose: Nose normal. Right sinus exhibits no maxillary sinus tenderness and no frontal sinus tenderness. Left sinus exhibits no maxillary sinus tenderness and no frontal sinus tenderness.  Mouth/Throat: Uvula is midline and oropharynx is clear and moist. No oropharyngeal exudate.  Eyes: Pupils are equal, round, and  reactive to light.  Neck: Normal range of motion. Neck supple. No JVD present.  Cardiovascular: Normal rate, regular rhythm and normal heart sounds.  Pulmonary/Chest: Effort normal and breath sounds normal. No accessory muscle usage. No respiratory distress. He has no decreased breath sounds. He has no wheezes. He has no rhonchi.  Abdominal: Soft. Bowel sounds are normal. There is no tenderness.  Musculoskeletal: Normal range of motion. He exhibits no edema.  Lymphadenopathy:    He has no cervical adenopathy.  Neurological: He is alert and oriented to person, place, and time. Gait normal.  Skin: Skin is warm and dry. He is not diaphoretic. No erythema.  Psychiatric: Mood, memory, affect and judgment normal.  Nursing note and vitals reviewed.   10/31/17 - FENO - 41   Lab Results:  CBC No results found for: WBC, RBC, HGB, HCT, PLT, MCV, MCH, MCHC, RDW, LYMPHSABS, MONOABS, EOSABS, BASOSABS  BMET No results found for: NA, K, CL, CO2, GLUCOSE, BUN, CREATININE, CALCIUM, GFRNONAA, GFRAA  BNP No results found for: BNP  ProBNP No results found for: PROBNP  Imaging: No results found.    Assessment & Plan:   Pleasant 47 year old patient seen today for follow-up visit with PFT.  Discussed results with patient.  Will restart patient on Flovent 110.  FeNO today is 41.  We will also add Singulair daily.  Patient follow-up with our office in 3 months.  Allergic-infective asthma Reviewed PFTs today  FENO today  - 41   We will start Flovent 110 inhaler >>> 2 puffs in the morning right when you wake up, rinse out your mouth after use, 12 hours later 2 puffs, rinse after use >>> Take this daily, no matter what >>> This is not a rescue inhaler   Only use your albuterol as a rescue medication to be used if you can't catch your breath by resting or doing a relaxed purse lip breathing pattern.  - The less you use it, the better it will work when you need it. - Ok to use up to 2 puffs  every  4 hours if you must but call for immediate appointment if use goes up over your usual need - Don't leave home without it !!  (think of it like the spare tire for your car)   Flu vaccine regular dose today  Follow-up with our office in 3 months >>>Please follow-up sooner if symptoms worsen or having to use your rescue inhaler more often   Snoring Will reorder home sleep study today  Seasonal allergic rhinitis We will add Singulair daily     Coral Ceo, NP 10/31/2017

## 2017-10-31 NOTE — Assessment & Plan Note (Signed)
Reviewed PFTs today  FENO today  - 41   We will start Flovent 110 inhaler >>> 2 puffs in the morning right when you wake up, rinse out your mouth after use, 12 hours later 2 puffs, rinse after use >>> Take this daily, no matter what >>> This is not a rescue inhaler   Only use your albuterol as a rescue medication to be used if you can't catch your breath by resting or doing a relaxed purse lip breathing pattern.  - The less you use it, the better it will work when you need it. - Ok to use up to 2 puffs  every 4 hours if you must but call for immediate appointment if use goes up over your usual need - Don't leave home without it !!  (think of it like the spare tire for your car)   Flu vaccine regular dose today  Follow-up with our office in 3 months >>>Please follow-up sooner if symptoms worsen or having to use your rescue inhaler more often

## 2017-10-31 NOTE — Patient Instructions (Addendum)
Reviewed PFTs today  FENO today     We will start Flovent 110 inhaler >>> 2 puffs in the morning right when you wake up, rinse out your mouth after use, 12 hours later 2 puffs, rinse after use >>> Take this daily, no matter what >>> This is not a rescue inhaler   Only use your albuterol as a rescue medication to be used if you can't catch your breath by resting or doing a relaxed purse lip breathing pattern.  - The less you use it, the better it will work when you need it. - Ok to use up to 2 puffs  every 4 hours if you must but call for immediate appointment if use goes up over your usual need - Don't leave home without it !!  (think of it like the spare tire for your car)   Flu vaccine regular dose today  Follow-up with our office in 3 months >>>Please follow-up sooner if symptoms worsen or having to use your rescue inhaler more often  Reorder home sleep study  It is flu season:   >>>Remember to be washing your hands regularly, using hand sanitizer, be careful to use around herself with has contact with people who are sick will increase her chances of getting sick yourself. >>> Best ways to protect herself from the flu: Receive the yearly flu vaccine, practice good hand hygiene washing with soap and also using hand sanitizer when available, eat a nutritious meals, get adequate rest, hydrate appropriately   Please contact the office if your symptoms worsen or you have concerns that you are not improving.   Thank you for choosing [facility] for your healthcare, and for allowing us to partner with you on your healthcare journey. I am thankful to be able to provide care to you today.   Elisha HeadlandBrian Conlee Sliter FNP-C      Influenza Virus Vaccine injection What is this medicine? INFLUENZA VIRUS VACCINE (in floo EN zuh VAHY ruhs vak SEEN) helps to reduce the risk of getting influenza also known as the flu. The vaccine only helps protect you against some strains of the flu. This  medicine may be used for other purposes; ask your health care provider or pharmacist if you have questions. COMMON BRAND NAME(S): Afluria, Agriflu, Alfuria, FLUAD, Fluarix, Fluarix Quadrivalent, Flublok, Flublok Quadrivalent, FLUCELVAX, Flulaval, Fluvirin, Fluzone, Fluzone High-Dose, Fluzone Intradermal What should I tell my health care provider before I take this medicine? They need to know if you have any of these conditions: -bleeding disorder like hemophilia -fever or infection -Guillain-Barre syndrome or other neurological problems -immune system problems -infection with the human immunodeficiency virus (HIV) or AIDS -low blood platelet counts -multiple sclerosis -an unusual or allergic reaction to influenza virus vaccine, latex, other medicines, foods, dyes, or preservatives. Different brands of vaccines contain different allergens. Some may contain latex or eggs. Talk to your doctor about your allergies to make sure that you get the right vaccine. -pregnant or trying to get pregnant -breast-feeding How should I use this medicine? This vaccine is for injection into a muscle or under the skin. It is given by a health care professional. A copy of Vaccine Information Statements will be given before each vaccination. Read this sheet carefully each time. The sheet may change frequently. Talk to your healthcare provider to see which vaccines are right for you. Some vaccines should not be used in all age groups. Overdosage: If you think you have taken too much of this medicine contact a  poison control center or emergency room at once. NOTE: This medicine is only for you. Do not share this medicine with others. What if I miss a dose? This does not apply. What may interact with this medicine? -chemotherapy or radiation therapy -medicines that lower your immune system like etanercept, anakinra, infliximab, and adalimumab -medicines that treat or prevent blood clots like  warfarin -phenytoin -steroid medicines like prednisone or cortisone -theophylline -vaccines This list may not describe all possible interactions. Give your health care provider a list of all the medicines, herbs, non-prescription drugs, or dietary supplements you use. Also tell them if you smoke, drink alcohol, or use illegal drugs. Some items may interact with your medicine. What should I watch for while using this medicine? Report any side effects that do not go away within 3 days to your doctor or health care professional. Call your health care provider if any unusual symptoms occur within 6 weeks of receiving this vaccine. You may still catch the flu, but the illness is not usually as bad. You cannot get the flu from the vaccine. The vaccine will not protect against colds or other illnesses that may cause fever. The vaccine is needed every year. What side effects may I notice from receiving this medicine? Side effects that you should report to your doctor or health care professional as soon as possible: -allergic reactions like skin rash, itching or hives, swelling of the face, lips, or tongue Side effects that usually do not require medical attention (report to your doctor or health care professional if they continue or are bothersome): -fever -headache -muscle aches and pains -pain, tenderness, redness, or swelling at the injection site -tiredness This list may not describe all possible side effects. Call your doctor for medical advice about side effects. You may report side effects to FDA at 1-800-FDA-1088. Where should I keep my medicine? The vaccine will be given by a health care professional in a clinic, pharmacy, doctor's office, or other health care setting. You will not be given vaccine doses to store at home. NOTE: This sheet is a summary. It may not cover all possible information. If you have questions about this medicine, talk to your doctor, pharmacist, or health care  provider.  2018 Elsevier/Gold Standard (2014-08-27 10:07:28)

## 2017-11-01 NOTE — Progress Notes (Signed)
Was able to talk to patient in office regarding patient's results.  They verbalized an understanding of what was discussed. No further questions at this time.

## 2017-11-07 DIAGNOSIS — K080 Exfoliation of teeth due to systemic causes: Secondary | ICD-10-CM | POA: Diagnosis not present

## 2017-11-10 DIAGNOSIS — G4733 Obstructive sleep apnea (adult) (pediatric): Secondary | ICD-10-CM | POA: Diagnosis not present

## 2017-11-11 DIAGNOSIS — G4733 Obstructive sleep apnea (adult) (pediatric): Secondary | ICD-10-CM

## 2017-11-13 ENCOUNTER — Other Ambulatory Visit: Payer: Self-pay | Admitting: *Deleted

## 2017-11-13 DIAGNOSIS — R0683 Snoring: Secondary | ICD-10-CM

## 2017-11-15 ENCOUNTER — Telehealth: Payer: Self-pay | Admitting: Pulmonary Disease

## 2017-11-15 NOTE — Telephone Encounter (Signed)
Noted.  Thank you please let me know if there is anything else that you need for me.  I agree with patient's follow-up.  Elisha Headland, FNP

## 2017-11-15 NOTE — Telephone Encounter (Signed)
Dr. Wynona Neat has reviewed the home sleep test this showed Moderately severe sleep apnea .With Moderate oxygen desaturations.   Recommendations   Treatment options are CPAP with the settings auto 5 to 15.    Weight loss measures .   Advise against driving while sleepy & against medication with sedative side effects.    Make appointment for 8 to 10 weeks for compliance with download with Dr. Maple Hudson.    I have left the patient a message to call us back.

## 2017-11-18 ENCOUNTER — Telehealth: Payer: Self-pay | Admitting: Pulmonary Disease

## 2017-11-18 DIAGNOSIS — G4733 Obstructive sleep apnea (adult) (pediatric): Secondary | ICD-10-CM

## 2017-11-18 NOTE — Telephone Encounter (Signed)
Called and spoke with patient regarding results.  Informed the patient of results and recommendations today. Placed order for auto CPAP with the settings auto5to 15, mask of choice and heated humidifier. Scheduled 43mo f/u appt with OL on Friday 01-10-18 at 1:30pm Pt verbalized understanding and denied any questions or concerns at this time.  Nothing further needed.

## 2017-11-18 NOTE — Telephone Encounter (Signed)
Pt is calling back [phone]

## 2017-11-18 NOTE — Telephone Encounter (Addendum)
Attempted to call patient today regarding results. I did not receive an answer at time of call. I have left a voicemail message for pt to return call. X1   11/15/17 3:58 PM  Note    Dr. Wynona Neat has reviewed the home sleep test this showed Moderately severe sleep apnea .With Moderate oxygen desaturations.   Recommendations   Treatment options are CPAP with the settings auto 5 to 15.               Weight loss measures .   Advise against driving while sleepy & against medication with sedative side effects.    Make appointment for 8 to 10 weeks for compliance with download with Dr. Maple Hudson.    I have left the patient a message to call us back.

## 2017-11-20 NOTE — Telephone Encounter (Signed)
LMOM to call back for results X2

## 2017-11-25 NOTE — Telephone Encounter (Signed)
Please see 9/30 phone note

## 2017-11-27 DIAGNOSIS — G4733 Obstructive sleep apnea (adult) (pediatric): Secondary | ICD-10-CM | POA: Diagnosis not present

## 2017-12-28 DIAGNOSIS — G4733 Obstructive sleep apnea (adult) (pediatric): Secondary | ICD-10-CM | POA: Diagnosis not present

## 2018-01-10 ENCOUNTER — Ambulatory Visit: Payer: Federal, State, Local not specified - PPO | Admitting: Pulmonary Disease

## 2018-01-10 ENCOUNTER — Encounter: Payer: Self-pay | Admitting: Pulmonary Disease

## 2018-01-10 VITALS — BP 116/74 | HR 76 | Ht 68.0 in | Wt 226.0 lb

## 2018-01-10 DIAGNOSIS — Z9989 Dependence on other enabling machines and devices: Secondary | ICD-10-CM | POA: Diagnosis not present

## 2018-01-10 DIAGNOSIS — G4733 Obstructive sleep apnea (adult) (pediatric): Secondary | ICD-10-CM

## 2018-01-10 NOTE — Progress Notes (Signed)
Ronald Stephenson    [id number]    January 09, 1971  Primary Care Physician:Patient, No Pcp Per  Referring Physician: No referring provider defined for this encounter.  Chief complaint:   Daytime sleepiness Recent sleep study revealing obstructive sleep apnea  HPI:  Patient has been using CPAP Very good compliance Residual AHI 1.7 symptoms of daytime sleepiness of improved  Patient usually goes to bed between 10 PM and 1030, wakes up about 5:25 AM Feels is not a good nights rest on most nights He feels much better No dry mouth No headaches  Family history of obstructive sleep apnea-dad has OSA  Never smoker    Outpatient Encounter Medications as of 01/10/2018  Medication Sig  . albuterol (PROVENTIL HFA;VENTOLIN HFA) 108 (90 Base) MCG/ACT inhaler Inhale 1-2 puffs into the lungs every 6 (six) hours as needed for wheezing or shortness of breath.  . fluticasone (FLONASE) 50 MCG/ACT nasal spray Place 1 spray into both nostrils daily.  . fluticasone (FLOVENT HFA) 110 MCG/ACT inhaler Inhale 2 puffs into the lungs 2 (two) times daily.  . montelukast (SINGULAIR) 10 MG tablet Take 1 tablet (10 mg total) by mouth at bedtime.  . MULTIPLE VITAMIN PO Take 1 tablet by mouth daily.   No facility-administered encounter medications on file as of 01/10/2018.     Allergies as of 01/10/2018 - Review Complete 01/10/2018  Allergen Reaction Noted  . Erythromycin Nausea And Vomiting 11/28/2011    Past Medical History:  Diagnosis Date  . Allergy     Past Surgical History:  Procedure Laterality Date  . WISDOM TOOTH EXTRACTION      Family History  Problem Relation Age of Onset  . Emphysema Maternal Grandmother   . Cancer Maternal Grandfather        bone cancer  . Heart disease Paternal Grandmother   . Heart disease Father   . Hypertension Father   . Asthma Sister     Social History   Socioeconomic History  . Marital status: Married    Spouse name: Not on file  . Number of  children: Not on file  . Years of education: Not on file  . Highest education level: Not on file  Occupational History  . Not on file  Social Needs  . Financial resource strain: Not on file  . Food insecurity:    Worry: Not on file    Inability: Not on file  . Transportation needs:    Medical: Not on file    Non-medical: Not on file  Tobacco Use  . Smoking status: Never Smoker  . Smokeless tobacco: Never Used  Substance and Sexual Activity  . Alcohol use: Yes    Comment: social  . Drug use: Not on file  . Sexual activity: Not on file  Lifestyle  . Physical activity:    Days per week: Not on file    Minutes per session: Not on file  . Stress: Not on file  Relationships  . Social connections:    Talks on phone: Not on file    Gets together: Not on file    Attends religious service: Not on file    Active member of club or organization: Not on file    Attends meetings of clubs or organizations: Not on file    Relationship status: Not on file  . Intimate partner violence:    Fear of current or ex partner: Not on file    Emotionally abused: Not on file  Physically abused: Not on file    Forced sexual activity: Not on file  Other Topics Concern  . Not on file  Social History Narrative  . Not on file    Review of Systems  Constitutional: Negative.   HENT: Negative.   Respiratory: Positive for apnea.   Cardiovascular: Negative for chest pain.  Psychiatric/Behavioral: Positive for sleep disturbance.  All other systems reviewed and are negative.   Vitals:   01/10/18 1340  BP: 116/74  Pulse: 76  SpO2: 97%     Physical Exam  Constitutional: He appears well-developed and well-nourished.  HENT:  Head: Normocephalic and atraumatic.  Eyes: Pupils are equal, round, and reactive to light. Conjunctivae are normal. Right eye exhibits no discharge. Left eye exhibits no discharge.  Neck: Normal range of motion. Neck supple. No tracheal deviation present. No thyromegaly  present.  Cardiovascular: Normal rate, regular rhythm and normal heart sounds. Exam reveals no friction rub.  No murmur heard. Pulmonary/Chest: Effort normal. No respiratory distress. He has no wheezes. He has no rales.  Abdominal: Soft. Bowel sounds are normal. He exhibits no distension. There is no tenderness.   Data Reviewed: Compliance data reviewed  Compliance with an AHI of 1.7 No significant mask leaks  Assessment:  Obstructive sleep apnea adequately treated with CPAP therapy  History of asthma-better controlled  Daytime sleepiness is much improved  Plan/Recommendations:   Pathophysiology of obstructive sleep apnea discussed Options of treatment discussed  Regular exercise encouraged Weight loss efforts encouraged  Continue CPAP therapy  I will see him back in the office in about 6 months   Virl Diamond MD [facility] 01/10/2018, 1:54 PM  CC: No ref. provider found

## 2018-01-10 NOTE — Patient Instructions (Addendum)
Obstructive sleep apnea adequately treated with CPAP therapy  Excellent compliance with CPAP with improvement in symptoms  No changes need to be made at present  Continue with your weight loss efforts  I will see you back in the office in about 6 months Call with any significant concerns

## 2018-01-27 DIAGNOSIS — G4733 Obstructive sleep apnea (adult) (pediatric): Secondary | ICD-10-CM | POA: Diagnosis not present

## 2018-02-04 ENCOUNTER — Encounter: Payer: Self-pay | Admitting: Emergency Medicine

## 2018-02-04 ENCOUNTER — Ambulatory Visit: Payer: Federal, State, Local not specified - PPO | Admitting: Emergency Medicine

## 2018-02-04 DIAGNOSIS — J45998 Other asthma: Secondary | ICD-10-CM

## 2018-02-04 DIAGNOSIS — G4733 Obstructive sleep apnea (adult) (pediatric): Secondary | ICD-10-CM

## 2018-02-04 NOTE — Assessment & Plan Note (Signed)
Continue your CPAP reliably every night and follow with Dr. Wynona Neatlalere as planned.

## 2018-02-04 NOTE — Patient Instructions (Addendum)
Please continue Flovent 2 puffs twice a day.  Remember to rinse and gargle after using. Please continue Singulair 10 mg each evening. Keep albuterol available to use 2 puffs up to every 4 hours if needed for shortness of breath, chest tightness, wheezing.  Flu shot up to date Continue your CPAP reliably every night and follow with Dr. Wynona Neatlalere as planned. Follow with Dr Delton CoombesByrum in 6 months or sooner if you have any problems

## 2018-02-04 NOTE — Progress Notes (Signed)
   Subjective:    Patient ID: Ronald Stephenson, adult    DOB: [birth year], 89 y.o.   MRN: [id number]  HPI 47 year old gentleman, never smoker, with a history of allergic rhinitis and mild intermittent asthma followed here by Dr. Maple HudsonYoung in the past.  Pulmonary function testing from 07/01/2012 was reviewed and shows moderate to moderately severe mixed obstruction and restriction with normal lung volumes and normal diffusion capacity.  There is a partial response to bronchodilator but this does not meet clinical significance.  He was formally managed on Advair, but has been off scheduled meds for over 4 years. He did use some flovent during times when he had URI. Rare SABA use.  His allergy regimen has been fluticasone NS during the Spring months.   He has been fairly well over the last several years, but has had a recent URI and was treated at urgent care with azithro, started on Wixela (powder, generic advair). May have helped his wheeze some. Still experiencing some cough. Non-productive, occasionally clear mucous. No SABA use - ran out of it.   ROV 02/04/18 --47 year old man who follows up today for his mild intermittent asthma, allergic rhinitis.  He is a never smoker.  Pulmonary function testing 10/31/2017 shows mild to moderate obstruction with a positive bronchodilator response.  He has been following Dr. Dirk Dresslelare for new diagnosis of sleep apnea and is on CPAP. His breathing has been doing very well. We changed him to Flovent and he is doing well. No albuterol use. He is on singulair and flonase. Flu shot up to date.    Review of Systems  Constitutional: Negative for fever and unexpected weight change.  HENT: Negative for congestion, dental problem, ear pain, nosebleeds, postnasal drip, rhinorrhea, sinus pressure, sneezing, sore throat and trouble swallowing.   Eyes: Negative for redness and itching.  Respiratory: Negative for cough, chest tightness, shortness of breath and wheezing.   Cardiovascular:  Negative for palpitations and leg swelling.  Gastrointestinal: Negative for nausea and vomiting.  Genitourinary: Negative for dysuria.  Musculoskeletal: Negative for joint swelling.  Skin: Negative for rash.  Neurological: Negative for headaches.  Hematological: Does not bruise/bleed easily.  Psychiatric/Behavioral: Negative for dysphoric mood. The patient is not nervous/anxious.        Objective:   Physical Exam Vitals:   02/04/18 1529  BP: 136/78  Pulse: 82  SpO2: 95%  Weight: 227 lb 6.4 oz (103.1 kg)  Height: 5\' 8"  (1.727 m)   Gen: Pleasant, well-nourished, in no distress,  normal affect  ENT: No lesions,  mouth clear,  oropharynx clear, narrow posterior pharynx, no postnasal drip  Neck: No JVD, no stridor  Lungs: No use of accessory muscles, clear B, no wheeze on a forced exp  Cardiovascular: RRR, heart sounds normal, no murmur or gallops, no peripheral edema  Musculoskeletal: No deformities, no cyanosis or clubbing  Neuro: alert, non focal  Skin: Warm, no lesions or rash     Assessment & Plan:  OSA (obstructive sleep apnea) Continue your CPAP reliably every night and follow with Dr. Wynona Neatlalere as planned.  Allergic-infective asthma Please continue Flovent 2 puffs twice a day.  Remember to rinse and gargle after using. Please continue Singulair 10 mg each evening. Keep albuterol available to use 2 puffs up to every 4 hours if needed for shortness of breath, chest tightness, wheezing.  Flu shot up to date  Levy Pupaobert Petro Talent, MD, PhD 02/04/2018, 3:59 PM [facility] [phone] or if no answer [phone]

## 2018-02-04 NOTE — Assessment & Plan Note (Signed)
Please continue Flovent 2 puffs twice a day.  Remember to rinse and gargle after using. Please continue Singulair 10 mg each evening. Keep albuterol available to use 2 puffs up to every 4 hours if needed for shortness of breath, chest tightness, wheezing.  Flu shot up to date

## 2018-02-27 DIAGNOSIS — G4733 Obstructive sleep apnea (adult) (pediatric): Secondary | ICD-10-CM | POA: Diagnosis not present

## 2018-07-08 ENCOUNTER — Ambulatory Visit: Payer: Federal, State, Local not specified - PPO | Admitting: Pulmonary Disease

## 2018-10-31 ENCOUNTER — Other Ambulatory Visit: Payer: Self-pay | Admitting: Pulmonary Disease

## 2018-11-20 DIAGNOSIS — G4733 Obstructive sleep apnea (adult) (pediatric): Secondary | ICD-10-CM | POA: Diagnosis not present

## 2018-12-26 ENCOUNTER — Other Ambulatory Visit: Payer: Self-pay | Admitting: Pulmonary Disease

## 2019-02-06 ENCOUNTER — Other Ambulatory Visit: Payer: Self-pay | Admitting: Pulmonary Disease

## 2019-06-15 DIAGNOSIS — Z13220 Encounter for screening for lipoid disorders: Secondary | ICD-10-CM | POA: Diagnosis not present

## 2019-06-15 DIAGNOSIS — Z0000 Encounter for general adult medical examination without abnormal findings: Secondary | ICD-10-CM | POA: Diagnosis not present

## 2020-12-25 DIAGNOSIS — J069 Acute upper respiratory infection, unspecified: Secondary | ICD-10-CM | POA: Diagnosis not present

## 2021-04-03 DIAGNOSIS — K121 Other forms of stomatitis: Secondary | ICD-10-CM | POA: Diagnosis not present

## 2021-04-03 DIAGNOSIS — K1379 Other lesions of oral mucosa: Secondary | ICD-10-CM | POA: Diagnosis not present

## 2021-04-03 DIAGNOSIS — J343 Hypertrophy of nasal turbinates: Secondary | ICD-10-CM | POA: Diagnosis not present

## 2021-04-11 ENCOUNTER — Other Ambulatory Visit: Payer: Self-pay | Admitting: Otolaryngology

## 2021-04-11 DIAGNOSIS — M8588 Other specified disorders of bone density and structure, other site: Secondary | ICD-10-CM

## 2021-04-18 DIAGNOSIS — Z13220 Encounter for screening for lipoid disorders: Secondary | ICD-10-CM | POA: Diagnosis not present

## 2021-04-18 DIAGNOSIS — R7303 Prediabetes: Secondary | ICD-10-CM | POA: Diagnosis not present

## 2021-04-18 DIAGNOSIS — Z0000 Encounter for general adult medical examination without abnormal findings: Secondary | ICD-10-CM | POA: Diagnosis not present

## 2021-04-18 DIAGNOSIS — Z1211 Encounter for screening for malignant neoplasm of colon: Secondary | ICD-10-CM | POA: Diagnosis not present

## 2021-04-18 DIAGNOSIS — Z125 Encounter for screening for malignant neoplasm of prostate: Secondary | ICD-10-CM | POA: Diagnosis not present

## 2021-04-21 ENCOUNTER — Ambulatory Visit
Admission: RE | Admit: 2021-04-21 | Discharge: 2021-04-21 | Disposition: A | Discharge status: Home / Self Care | Payer: Federal, State, Local not specified - PPO | Source: Ambulatory Visit | Attending: Otolaryngology | Admitting: Otolaryngology

## 2021-04-21 DIAGNOSIS — M8588 Other specified disorders of bone density and structure, other site: Secondary | ICD-10-CM

## 2021-04-21 DIAGNOSIS — J3489 Other specified disorders of nose and nasal sinuses: Secondary | ICD-10-CM | POA: Diagnosis not present

## 2021-04-21 DIAGNOSIS — R221 Localized swelling, mass and lump, neck: Secondary | ICD-10-CM | POA: Diagnosis not present

## 2021-04-21 DIAGNOSIS — R220 Localized swelling, mass and lump, head: Secondary | ICD-10-CM | POA: Diagnosis not present

## 2021-04-21 MED ORDER — IOPAMIDOL (ISOVUE-300) INJECTION 61%
75.0000 mL | Freq: Once | INTRAVENOUS | Status: AC | PRN
  Administered 2021-04-21: 75 mL via INTRAVENOUS

## 2021-05-17 DIAGNOSIS — J3489 Other specified disorders of nose and nasal sinuses: Secondary | ICD-10-CM | POA: Diagnosis not present

## 2021-05-17 DIAGNOSIS — M8588 Other specified disorders of bone density and structure, other site: Secondary | ICD-10-CM | POA: Diagnosis not present

## 2021-05-22 DIAGNOSIS — J3489 Other specified disorders of nose and nasal sinuses: Secondary | ICD-10-CM | POA: Diagnosis not present

## 2021-05-22 DIAGNOSIS — M8588 Other specified disorders of bone density and structure, other site: Secondary | ICD-10-CM | POA: Diagnosis not present

## 2021-06-03 DIAGNOSIS — M8588 Other specified disorders of bone density and structure, other site: Secondary | ICD-10-CM | POA: Diagnosis not present

## 2021-06-08 DIAGNOSIS — Z881 Allergy status to other antibiotic agents status: Secondary | ICD-10-CM | POA: Diagnosis not present

## 2021-06-08 DIAGNOSIS — R220 Localized swelling, mass and lump, head: Secondary | ICD-10-CM | POA: Diagnosis not present

## 2021-06-08 DIAGNOSIS — G4733 Obstructive sleep apnea (adult) (pediatric): Secondary | ICD-10-CM | POA: Diagnosis not present

## 2021-06-08 DIAGNOSIS — Z4659 Encounter for fitting and adjustment of other gastrointestinal appliance and device: Secondary | ICD-10-CM | POA: Diagnosis not present

## 2021-06-08 DIAGNOSIS — C050 Malignant neoplasm of hard palate: Secondary | ICD-10-CM | POA: Diagnosis not present

## 2021-06-13 DIAGNOSIS — R1312 Dysphagia, oropharyngeal phase: Secondary | ICD-10-CM | POA: Diagnosis not present

## 2021-06-27 DIAGNOSIS — M8588 Other specified disorders of bone density and structure, other site: Secondary | ICD-10-CM | POA: Diagnosis not present

## 2021-08-29 DIAGNOSIS — C050 Malignant neoplasm of hard palate: Secondary | ICD-10-CM | POA: Diagnosis not present

## 2021-08-29 DIAGNOSIS — J3489 Other specified disorders of nose and nasal sinuses: Secondary | ICD-10-CM | POA: Diagnosis not present

## 2021-08-29 DIAGNOSIS — H6983 Other specified disorders of Eustachian tube, bilateral: Secondary | ICD-10-CM | POA: Diagnosis not present

## 2021-09-06 DIAGNOSIS — Z0389 Encounter for observation for other suspected diseases and conditions ruled out: Secondary | ICD-10-CM | POA: Diagnosis not present

## 2021-09-06 DIAGNOSIS — M8588 Other specified disorders of bone density and structure, other site: Secondary | ICD-10-CM | POA: Diagnosis not present

## 2021-09-06 DIAGNOSIS — Z9089 Acquired absence of other organs: Secondary | ICD-10-CM | POA: Diagnosis not present

## 2021-09-06 DIAGNOSIS — Z9009 Acquired absence of other part of head and neck: Secondary | ICD-10-CM | POA: Diagnosis not present

## 2021-09-06 DIAGNOSIS — J0100 Acute maxillary sinusitis, unspecified: Secondary | ICD-10-CM | POA: Diagnosis not present

## 2021-09-11 DIAGNOSIS — E782 Mixed hyperlipidemia: Secondary | ICD-10-CM | POA: Diagnosis not present

## 2021-09-11 DIAGNOSIS — E1165 Type 2 diabetes mellitus with hyperglycemia: Secondary | ICD-10-CM | POA: Diagnosis not present

## 2021-09-22 DIAGNOSIS — C050 Malignant neoplasm of hard palate: Secondary | ICD-10-CM | POA: Diagnosis not present

## 2021-09-22 DIAGNOSIS — H900 Conductive hearing loss, bilateral: Secondary | ICD-10-CM | POA: Diagnosis not present

## 2021-09-22 DIAGNOSIS — H6983 Other specified disorders of Eustachian tube, bilateral: Secondary | ICD-10-CM | POA: Diagnosis not present

## 2021-09-22 DIAGNOSIS — H6523 Chronic serous otitis media, bilateral: Secondary | ICD-10-CM | POA: Diagnosis not present

## 2021-10-13 DIAGNOSIS — H6983 Other specified disorders of Eustachian tube, bilateral: Secondary | ICD-10-CM | POA: Diagnosis not present

## 2021-10-18 DIAGNOSIS — G4730 Sleep apnea, unspecified: Secondary | ICD-10-CM | POA: Diagnosis not present

## 2021-10-18 DIAGNOSIS — C050 Malignant neoplasm of hard palate: Secondary | ICD-10-CM | POA: Diagnosis not present

## 2021-10-30 DIAGNOSIS — M8588 Other specified disorders of bone density and structure, other site: Secondary | ICD-10-CM | POA: Diagnosis not present

## 2021-10-30 DIAGNOSIS — G4733 Obstructive sleep apnea (adult) (pediatric): Secondary | ICD-10-CM | POA: Diagnosis not present

## 2021-10-30 DIAGNOSIS — C050 Malignant neoplasm of hard palate: Secondary | ICD-10-CM | POA: Diagnosis not present

## 2021-10-30 DIAGNOSIS — Z789 Other specified health status: Secondary | ICD-10-CM | POA: Diagnosis not present

## 2021-11-11 DIAGNOSIS — C050 Malignant neoplasm of hard palate: Secondary | ICD-10-CM | POA: Diagnosis not present

## 2021-11-20 DIAGNOSIS — G4733 Obstructive sleep apnea (adult) (pediatric): Secondary | ICD-10-CM | POA: Diagnosis not present

## 2021-11-22 DIAGNOSIS — Z85818 Personal history of malignant neoplasm of other sites of lip, oral cavity, and pharynx: Secondary | ICD-10-CM | POA: Diagnosis not present

## 2021-11-22 DIAGNOSIS — Z789 Other specified health status: Secondary | ICD-10-CM | POA: Diagnosis not present

## 2021-11-22 DIAGNOSIS — G4730 Sleep apnea, unspecified: Secondary | ICD-10-CM | POA: Diagnosis not present

## 2021-11-22 DIAGNOSIS — G4733 Obstructive sleep apnea (adult) (pediatric): Secondary | ICD-10-CM | POA: Diagnosis not present

## 2021-12-06 DIAGNOSIS — J3489 Other specified disorders of nose and nasal sinuses: Secondary | ICD-10-CM | POA: Diagnosis not present

## 2021-12-06 DIAGNOSIS — C050 Malignant neoplasm of hard palate: Secondary | ICD-10-CM | POA: Diagnosis not present

## 2021-12-06 DIAGNOSIS — M8588 Other specified disorders of bone density and structure, other site: Secondary | ICD-10-CM | POA: Diagnosis not present

## 2021-12-07 DIAGNOSIS — G4733 Obstructive sleep apnea (adult) (pediatric): Secondary | ICD-10-CM | POA: Diagnosis not present

## 2022-01-10 DIAGNOSIS — Z6828 Body mass index (BMI) 28.0-28.9, adult: Secondary | ICD-10-CM | POA: Diagnosis not present

## 2022-01-10 DIAGNOSIS — G4733 Obstructive sleep apnea (adult) (pediatric): Secondary | ICD-10-CM | POA: Diagnosis not present

## 2022-01-10 DIAGNOSIS — Z789 Other specified health status: Secondary | ICD-10-CM | POA: Diagnosis not present

## 2022-01-31 DIAGNOSIS — H900 Conductive hearing loss, bilateral: Secondary | ICD-10-CM | POA: Diagnosis not present

## 2022-01-31 DIAGNOSIS — C050 Malignant neoplasm of hard palate: Secondary | ICD-10-CM | POA: Diagnosis not present

## 2022-02-13 DIAGNOSIS — M8588 Other specified disorders of bone density and structure, other site: Secondary | ICD-10-CM | POA: Diagnosis not present

## 2022-02-13 DIAGNOSIS — G4733 Obstructive sleep apnea (adult) (pediatric): Secondary | ICD-10-CM | POA: Diagnosis not present

## 2022-02-13 DIAGNOSIS — Z789 Other specified health status: Secondary | ICD-10-CM | POA: Diagnosis not present

## 2022-02-26 DIAGNOSIS — Z9682 Presence of neurostimulator: Secondary | ICD-10-CM | POA: Diagnosis not present

## 2022-02-26 DIAGNOSIS — G4733 Obstructive sleep apnea (adult) (pediatric): Secondary | ICD-10-CM | POA: Diagnosis not present

## 2022-03-08 DIAGNOSIS — H6993 Unspecified Eustachian tube disorder, bilateral: Secondary | ICD-10-CM | POA: Diagnosis not present

## 2022-03-26 DIAGNOSIS — H6521 Chronic serous otitis media, right ear: Secondary | ICD-10-CM | POA: Diagnosis not present

## 2022-03-26 DIAGNOSIS — Z9622 Myringotomy tube(s) status: Secondary | ICD-10-CM | POA: Diagnosis not present

## 2022-03-26 DIAGNOSIS — H9011 Conductive hearing loss, unilateral, right ear, with unrestricted hearing on the contralateral side: Secondary | ICD-10-CM | POA: Diagnosis not present

## 2022-03-26 DIAGNOSIS — H6993 Unspecified Eustachian tube disorder, bilateral: Secondary | ICD-10-CM | POA: Diagnosis not present

## 2022-04-20 DIAGNOSIS — Z23 Encounter for immunization: Secondary | ICD-10-CM | POA: Diagnosis not present

## 2022-04-20 DIAGNOSIS — E782 Mixed hyperlipidemia: Secondary | ICD-10-CM | POA: Diagnosis not present

## 2022-04-20 DIAGNOSIS — Z0000 Encounter for general adult medical examination without abnormal findings: Secondary | ICD-10-CM | POA: Diagnosis not present

## 2022-04-20 DIAGNOSIS — E1165 Type 2 diabetes mellitus with hyperglycemia: Secondary | ICD-10-CM | POA: Diagnosis not present

## 2022-06-13 DIAGNOSIS — C050 Malignant neoplasm of hard palate: Secondary | ICD-10-CM | POA: Diagnosis not present

## 2022-06-26 DIAGNOSIS — Z789 Other specified health status: Secondary | ICD-10-CM | POA: Diagnosis not present

## 2022-06-26 DIAGNOSIS — Z9682 Presence of neurostimulator: Secondary | ICD-10-CM | POA: Diagnosis not present

## 2022-06-26 DIAGNOSIS — G4733 Obstructive sleep apnea (adult) (pediatric): Secondary | ICD-10-CM | POA: Diagnosis not present

## 2022-09-17 DIAGNOSIS — C050 Malignant neoplasm of hard palate: Secondary | ICD-10-CM | POA: Diagnosis not present

## 2022-09-26 DIAGNOSIS — R051 Acute cough: Secondary | ICD-10-CM | POA: Diagnosis not present

## 2022-09-27 DIAGNOSIS — Z789 Other specified health status: Secondary | ICD-10-CM | POA: Diagnosis not present

## 2022-09-27 DIAGNOSIS — G4733 Obstructive sleep apnea (adult) (pediatric): Secondary | ICD-10-CM | POA: Diagnosis not present

## 2022-09-27 DIAGNOSIS — Z9682 Presence of neurostimulator: Secondary | ICD-10-CM | POA: Diagnosis not present

## 2022-10-04 DIAGNOSIS — G4733 Obstructive sleep apnea (adult) (pediatric): Secondary | ICD-10-CM | POA: Diagnosis not present

## 2022-10-17 DIAGNOSIS — C050 Malignant neoplasm of hard palate: Secondary | ICD-10-CM | POA: Diagnosis not present

## 2022-10-17 DIAGNOSIS — H6993 Unspecified Eustachian tube disorder, bilateral: Secondary | ICD-10-CM | POA: Diagnosis not present

## 2022-10-30 DIAGNOSIS — Z789 Other specified health status: Secondary | ICD-10-CM | POA: Diagnosis not present

## 2022-10-30 DIAGNOSIS — G4733 Obstructive sleep apnea (adult) (pediatric): Secondary | ICD-10-CM | POA: Diagnosis not present

## 2022-10-30 DIAGNOSIS — C050 Malignant neoplasm of hard palate: Secondary | ICD-10-CM | POA: Diagnosis not present

## 2022-10-30 DIAGNOSIS — M8588 Other specified disorders of bone density and structure, other site: Secondary | ICD-10-CM | POA: Diagnosis not present

## 2022-11-05 DIAGNOSIS — R030 Elevated blood-pressure reading, without diagnosis of hypertension: Secondary | ICD-10-CM | POA: Diagnosis not present

## 2022-11-05 DIAGNOSIS — E782 Mixed hyperlipidemia: Secondary | ICD-10-CM | POA: Diagnosis not present

## 2022-11-05 DIAGNOSIS — E1165 Type 2 diabetes mellitus with hyperglycemia: Secondary | ICD-10-CM | POA: Diagnosis not present

## 2023-02-27 DIAGNOSIS — C050 Malignant neoplasm of hard palate: Secondary | ICD-10-CM | POA: Diagnosis not present

## 2023-03-26 DIAGNOSIS — G4733 Obstructive sleep apnea (adult) (pediatric): Secondary | ICD-10-CM | POA: Diagnosis not present

## 2023-03-26 DIAGNOSIS — C050 Malignant neoplasm of hard palate: Secondary | ICD-10-CM | POA: Diagnosis not present

## 2023-03-26 DIAGNOSIS — Z789 Other specified health status: Secondary | ICD-10-CM | POA: Diagnosis not present

## 2023-03-26 DIAGNOSIS — Z9682 Presence of neurostimulator: Secondary | ICD-10-CM | POA: Diagnosis not present

## 2023-04-29 DIAGNOSIS — J4520 Mild intermittent asthma, uncomplicated: Secondary | ICD-10-CM | POA: Diagnosis not present

## 2023-04-29 DIAGNOSIS — Z125 Encounter for screening for malignant neoplasm of prostate: Secondary | ICD-10-CM | POA: Diagnosis not present

## 2023-04-29 DIAGNOSIS — E119 Type 2 diabetes mellitus without complications: Secondary | ICD-10-CM | POA: Diagnosis not present

## 2023-04-29 DIAGNOSIS — G4733 Obstructive sleep apnea (adult) (pediatric): Secondary | ICD-10-CM | POA: Diagnosis not present

## 2023-04-29 DIAGNOSIS — Z0000 Encounter for general adult medical examination without abnormal findings: Secondary | ICD-10-CM | POA: Diagnosis not present

## 2023-04-29 DIAGNOSIS — E782 Mixed hyperlipidemia: Secondary | ICD-10-CM | POA: Diagnosis not present

## 2023-04-29 DIAGNOSIS — Z23 Encounter for immunization: Secondary | ICD-10-CM | POA: Diagnosis not present

## 2023-04-30 DIAGNOSIS — H6993 Unspecified Eustachian tube disorder, bilateral: Secondary | ICD-10-CM | POA: Diagnosis not present

## 2023-04-30 DIAGNOSIS — C050 Malignant neoplasm of hard palate: Secondary | ICD-10-CM | POA: Diagnosis not present

## 2023-04-30 DIAGNOSIS — Z9622 Myringotomy tube(s) status: Secondary | ICD-10-CM | POA: Diagnosis not present

## 2023-04-30 DIAGNOSIS — H65492 Other chronic nonsuppurative otitis media, left ear: Secondary | ICD-10-CM | POA: Diagnosis not present

## 2023-05-31 DIAGNOSIS — Z23 Encounter for immunization: Secondary | ICD-10-CM | POA: Diagnosis not present

## 2023-06-20 DIAGNOSIS — J4521 Mild intermittent asthma with (acute) exacerbation: Secondary | ICD-10-CM | POA: Diagnosis not present

## 2023-07-01 DIAGNOSIS — J4520 Mild intermittent asthma, uncomplicated: Secondary | ICD-10-CM | POA: Diagnosis not present

## 2023-07-25 IMAGING — CT CT NECK W/ CM
3 of 12 series · 9 of 33 positions shown, 10 images · IV contrast (agent unspecified)
Comparison: None.

CLINICAL DATA: Massive hard palate with negative biopsy per patient
3 years ago

EXAM:
CT NECK WITH CONTRAST
TECHNIQUE: Multidetector CT imaging of the neck was performed using the
standard protocol following the bolus administration of intravenous
contrast.

[Series 2: neck 2.00 br40 s3 st/ no angle · axial · 0.48mm/px · z∈[-842,-634]mm · 3 of 105 slices shown, 4 images]
[im 1/105  soft-tissue]
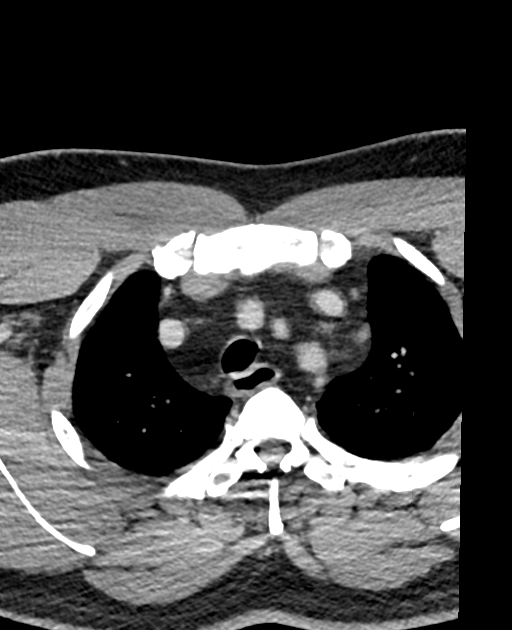
[im 1/105  bone]
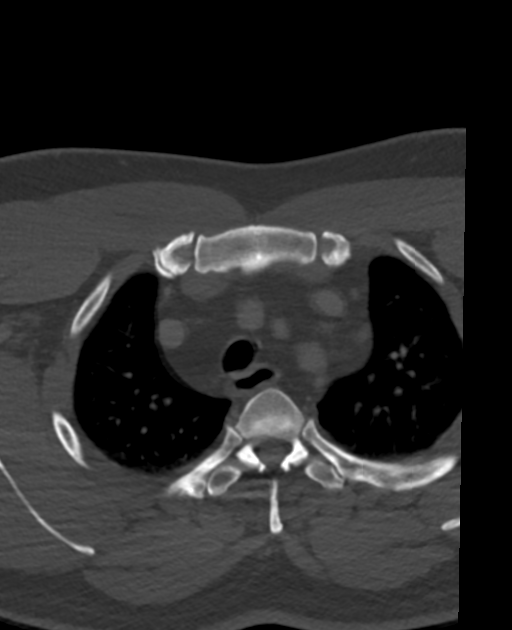
[im 53/105  bone]
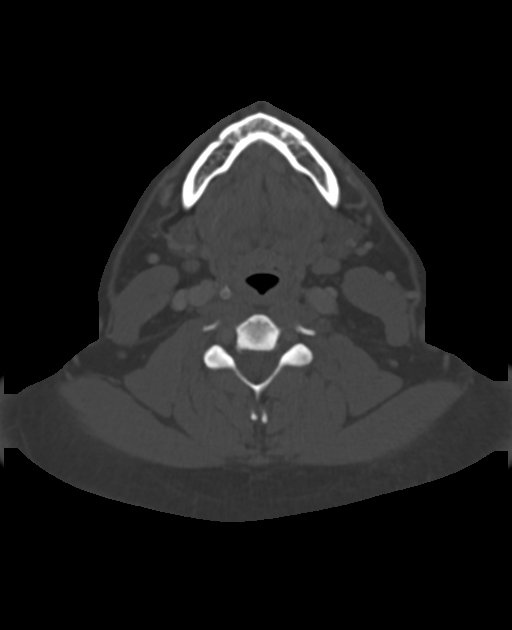
[im 105/105  bone]
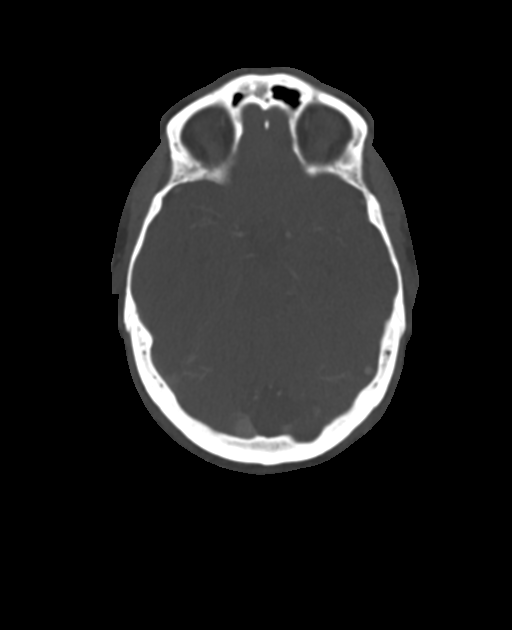

[Series 10: neck 2.00 br40 s3 (person_name) · coronal · 0.41mm/px · 1 of 151 slices shown (1 of 2)]
[im 76/151  bone]
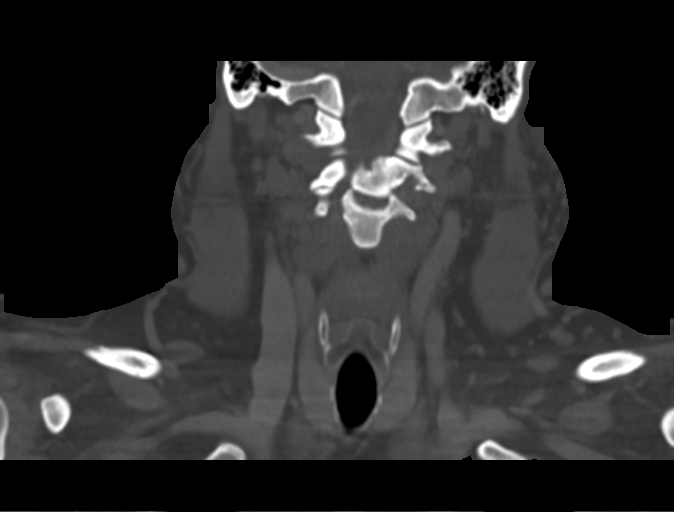

[Series 12: neck 2.00 br40 s3 (person_name) · sagittal · 0.41mm/px · 5 of 139 slices shown (2 of 2)]
[im 24/139  bone]
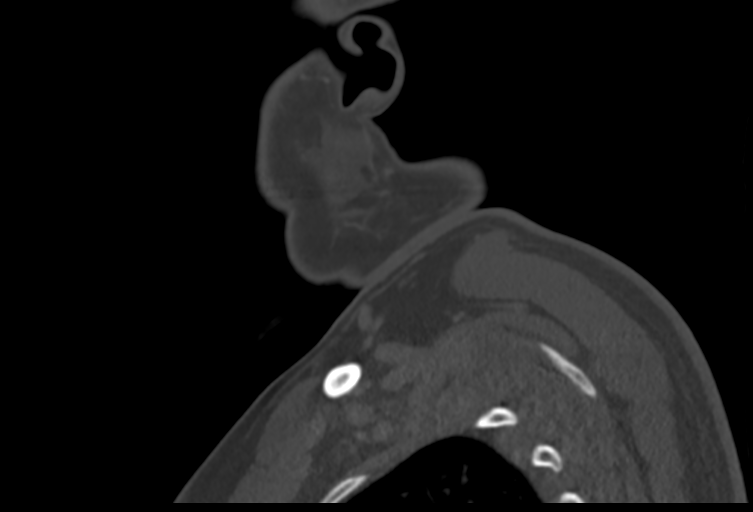
[im 47/139  bone]
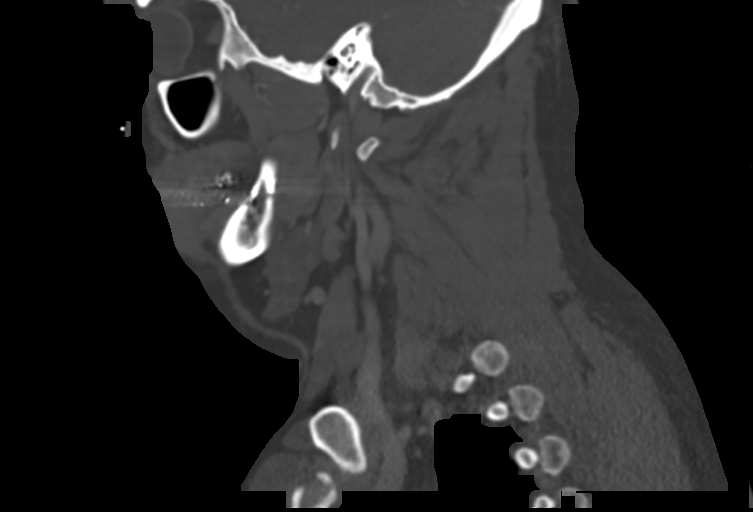
[im 70/139  bone]
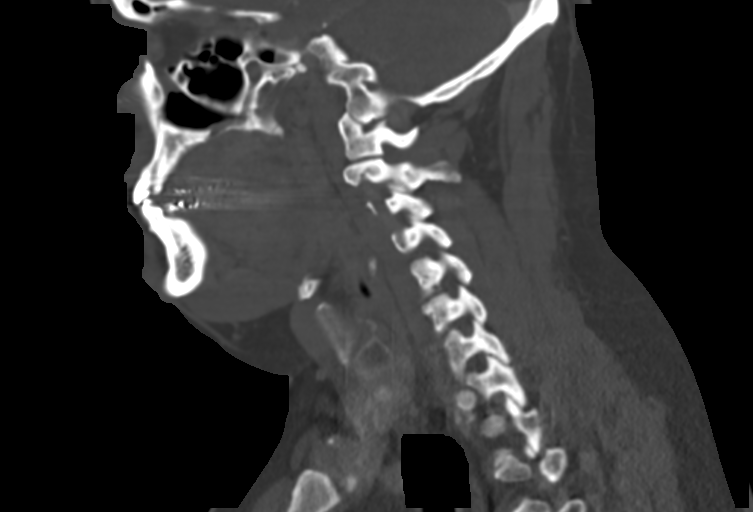
[im 93/139  bone]
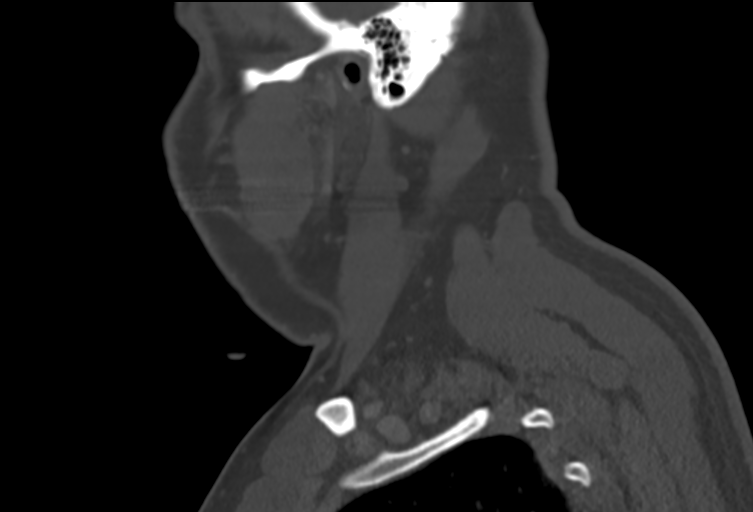
[im 116/139  bone]
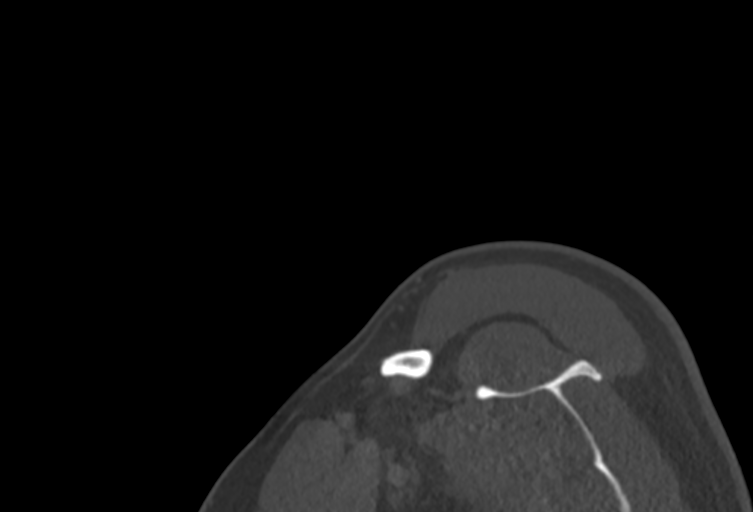

[9 of 33 positions shown; findings below may reference images not displayed]

RADIATION DOSE REDUCTION: This exam was performed according to the
departmental dose-optimization program which includes automated
exposure control, adjustment of the mA and/or kV according to
patient size and/or use of iterative reconstruction technique.

CONTRAST:  75mL LSTSSZ-AAA IOPAMIDOL (LSTSSZ-AAA) INJECTION 61%
FINDINGS: Pharynx and larynx: Enhancing mass at the right paramedian palate
measuring up to 3 cm anterior to posterior and 2 cm in thickness.
There is growth through the right hard palate into the lower right
nasal cavity. The adjacent greater palatine foramen is preserved, as
is skull base fat planes. Anticipate histologic correlation.

Salivary glands: No inflammation, mass, or stone.

Thyroid: Normal.

Lymph nodes: None enlarged or abnormal density.

Vascular: Negative.

Limited intracranial: Negative.

Visualized orbits: Negative

Mastoids and visualized paranasal sinuses: Mild mucosal thickening
along the floors of the maxillary sinuses. Presumed retention cyst
in the right sphenoid sinus.

Skeleton: Right palate findings as above

Upper chest: Negative
IMPRESSION: 3 x 2 cm right paramedian palate mass which grows through the bony
palate into the lower nasal cavity.

## 2023-09-04 DIAGNOSIS — C050 Malignant neoplasm of hard palate: Secondary | ICD-10-CM | POA: Diagnosis not present

## 2023-09-06 DIAGNOSIS — Z23 Encounter for immunization: Secondary | ICD-10-CM | POA: Diagnosis not present

## 2023-11-11 DIAGNOSIS — E782 Mixed hyperlipidemia: Secondary | ICD-10-CM | POA: Diagnosis not present

## 2023-11-11 DIAGNOSIS — G4733 Obstructive sleep apnea (adult) (pediatric): Secondary | ICD-10-CM | POA: Diagnosis not present

## 2023-11-11 DIAGNOSIS — J4530 Mild persistent asthma, uncomplicated: Secondary | ICD-10-CM | POA: Diagnosis not present

## 2023-11-11 DIAGNOSIS — E119 Type 2 diabetes mellitus without complications: Secondary | ICD-10-CM | POA: Diagnosis not present

## 2023-11-12 ENCOUNTER — Other Ambulatory Visit (HOSPITAL_BASED_OUTPATIENT_CLINIC_OR_DEPARTMENT_OTHER): Payer: Self-pay | Admitting: Physician Assistant

## 2023-11-12 DIAGNOSIS — E782 Mixed hyperlipidemia: Secondary | ICD-10-CM

## 2023-12-02 ENCOUNTER — Encounter: Payer: Self-pay | Admitting: Podiatry

## 2023-12-02 ENCOUNTER — Ambulatory Visit: Admitting: Podiatry

## 2023-12-02 DIAGNOSIS — E1142 Type 2 diabetes mellitus with diabetic polyneuropathy: Secondary | ICD-10-CM | POA: Diagnosis not present

## 2023-12-02 NOTE — Progress Notes (Signed)
  Subjective:  Patient ID: Ronald Stephenson, adult    DOB: February 08, 1971,   MRN: [id number]  Chief Complaint  Patient presents with   Toe Pain    Bilateral toe numbness in ends of all toes x 2 months. Diabetic A1c  5.8 sept 2025.no anti coag    53 y.o. adult presents for concern of numbness in all toes at the ends that has been ongoing for 2 months. Denies burning and tingling in their feet. Patient is diabetic and last A1c was 5.8. Denies any treatments  PCP:  Patient, No Pcp Per    . Denies any other pedal complaints. Denies n/v/f/c.   Past Medical History:  Diagnosis Date   Allergy      Objective:  Physical Exam: Vascular: DP/PT pulses 2/4 bilateral. CFT <3 seconds. Feet cold to touch. Absent hair growth on digits. Edema noted to bilateral lower extremities. Xerosis noted bilaterally.  Skin. No lacerations or abrasions bilateral feet. Nails 1-5 bilateral  are normal in appearance.  Musculoskeletal: MMT 5/5 bilateral lower extremities in DF, PF, Inversion and Eversion. Deceased ROM in DF of ankle joint.  Neurological: Sensation intact to light touch. Protective sensation intact  bilateral. Vibratory sensation intact.    Assessment:   1. Type 2 diabetes mellitus with peripheral neuropathy (HCC)      Plan:  Patient was evaluated and treated and all questions answered. Discussed neuropathy and etiology as well as treatment with patient.  Radiographs reviewed and discussed with patient.  -Discussed and educated patient on diabetic foot care, especially with  regards to the vascular, neurological and musculoskeletal systems.  -Stressed the importance of good glycemic control and the detriment of not  controlling glucose levels in relation to the foot. -Discussed supportive shoes at all times and checking feet regularly.  -Patient to return in 1 year for DM foot check.    Asberry Failing, DPM

## 2023-12-03 ENCOUNTER — Ambulatory Visit (HOSPITAL_BASED_OUTPATIENT_CLINIC_OR_DEPARTMENT_OTHER)
Admission: RE | Admit: 2023-12-03 | Discharge: 2023-12-03 | Disposition: A | Discharge status: Home / Self Care | Payer: Self-pay | Source: Ambulatory Visit | Attending: Physician Assistant | Admitting: Physician Assistant

## 2023-12-03 DIAGNOSIS — E782 Mixed hyperlipidemia: Secondary | ICD-10-CM | POA: Insufficient documentation
